# Patient Record
Sex: Female | Born: 1970 | Race: Black or African American | Hispanic: No | Smoking: Never smoker
Health system: Southern US, Community
[De-identification: ages and names within clinical notes are randomized; demographics above are authoritative.]

## PROBLEM LIST (undated history)

## (undated) DIAGNOSIS — Z8249 Family history of ischemic heart disease and other diseases of the circulatory system: Secondary | ICD-10-CM

## (undated) DIAGNOSIS — E042 Nontoxic multinodular goiter: Secondary | ICD-10-CM

## (undated) HISTORY — DX: Family history of ischemic heart disease and other diseases of the circulatory system: Z82.49

## (undated) HISTORY — PX: ABDOMINAL HYSTERECTOMY: SHX81

---

## 1997-07-26 HISTORY — PX: MYOMECTOMY: SHX85

## 1998-07-26 HISTORY — PX: MYOMECTOMY: SHX85

## 2002-07-26 HISTORY — PX: OTHER SURGICAL HISTORY: SHX169

## 2012-07-26 HISTORY — PX: OTHER SURGICAL HISTORY: SHX169

## 2013-02-27 ENCOUNTER — Other Ambulatory Visit: Payer: Self-pay | Admitting: Endocrinology

## 2013-02-27 DIAGNOSIS — E041 Nontoxic single thyroid nodule: Secondary | ICD-10-CM

## 2013-03-07 ENCOUNTER — Ambulatory Visit
Admission: RE | Admit: 2013-03-07 | Discharge: 2013-03-07 | Disposition: A | Discharge status: Home / Self Care | Payer: 59 | Source: Ambulatory Visit | Attending: Endocrinology | Admitting: Endocrinology

## 2013-03-07 ENCOUNTER — Other Ambulatory Visit (HOSPITAL_COMMUNITY)
Admission: RE | Admit: 2013-03-07 | Discharge: 2013-03-07 | Disposition: A | Discharge status: Home / Self Care | Payer: 59 | Source: Ambulatory Visit | Attending: Interventional Radiology | Admitting: Interventional Radiology

## 2013-03-07 DIAGNOSIS — E041 Nontoxic single thyroid nodule: Secondary | ICD-10-CM | POA: Insufficient documentation

## 2013-03-07 IMAGING — US US THYROID BIOPSY
1 series · 14 of 14 positions shown · non-contrast
Comparison: [DATE]

CLINICAL DATA: Dominant left mid thyroid nodule

ULTRASOUND GUIDED NEEDLE ASPIRATE BIOPSY OF THE THYROID GLAND

[Series 1: us thyroid biopsy · 0.06mm/px · 14 acquisitions, 14 frames shown]
[im 1/14]
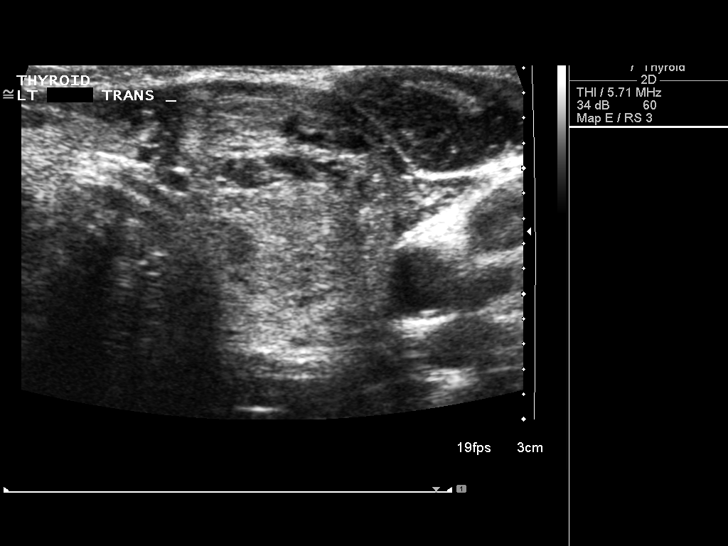
[im 2/14]
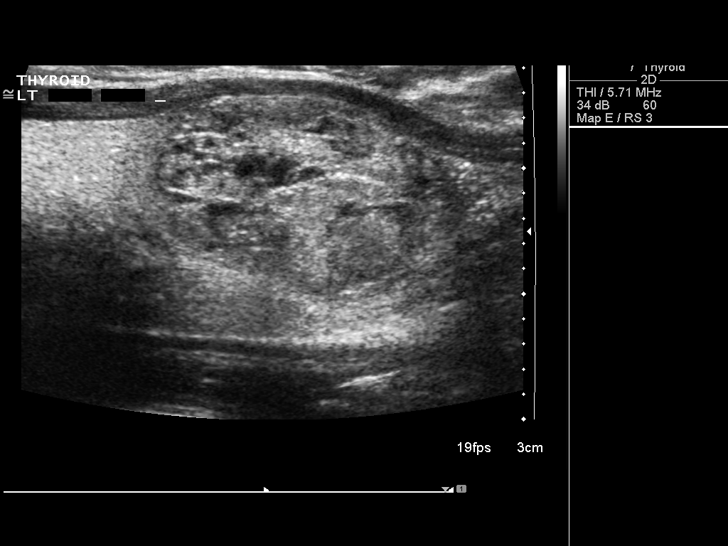
[im 3/14]
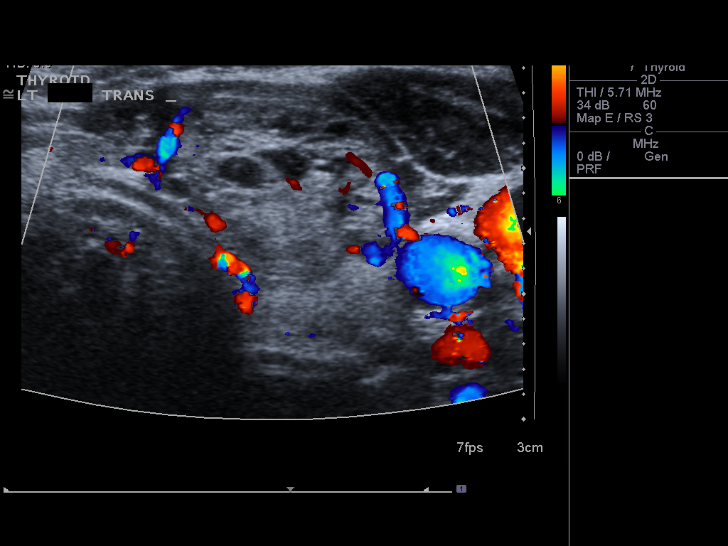
[im 4/14]
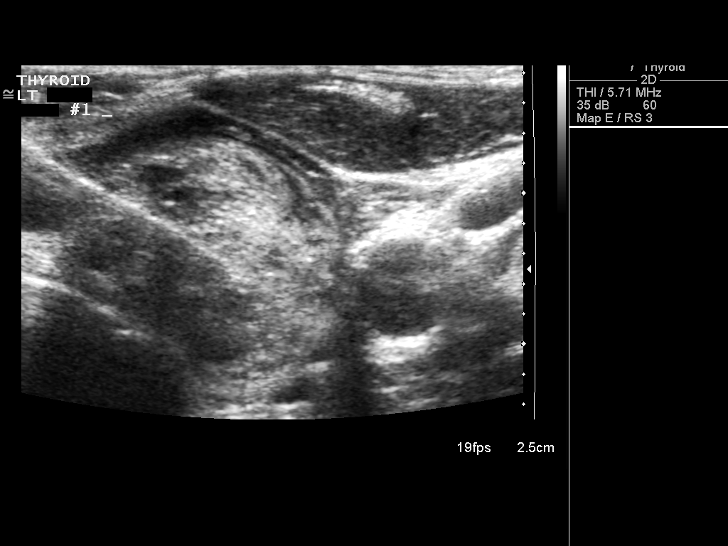
[im 5/14]
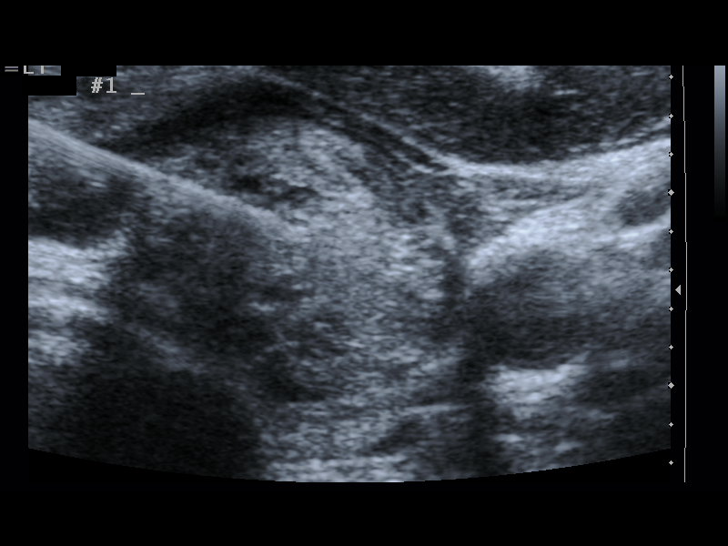
[im 6/14]
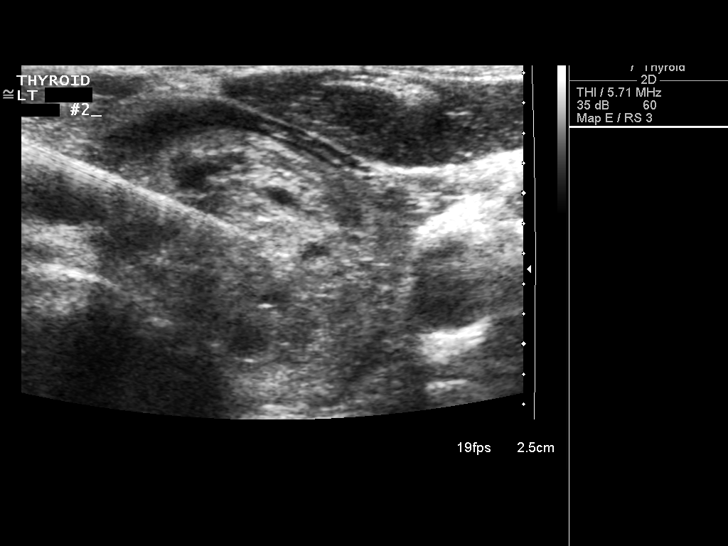
[im 7/14]
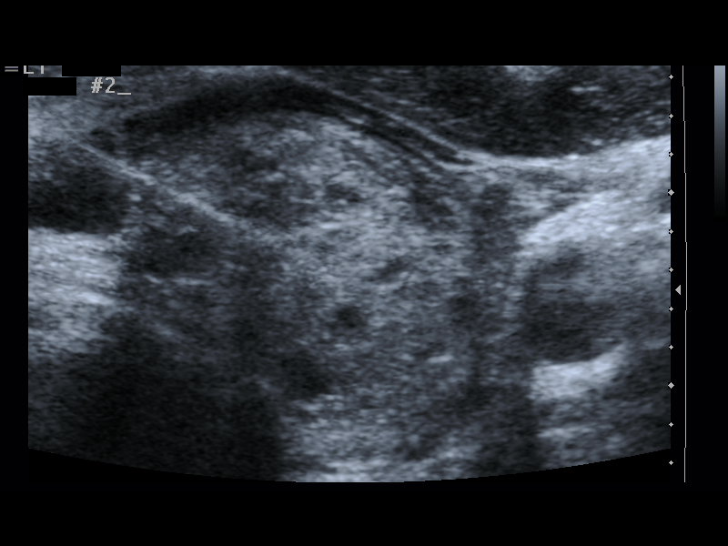
[im 8/14]
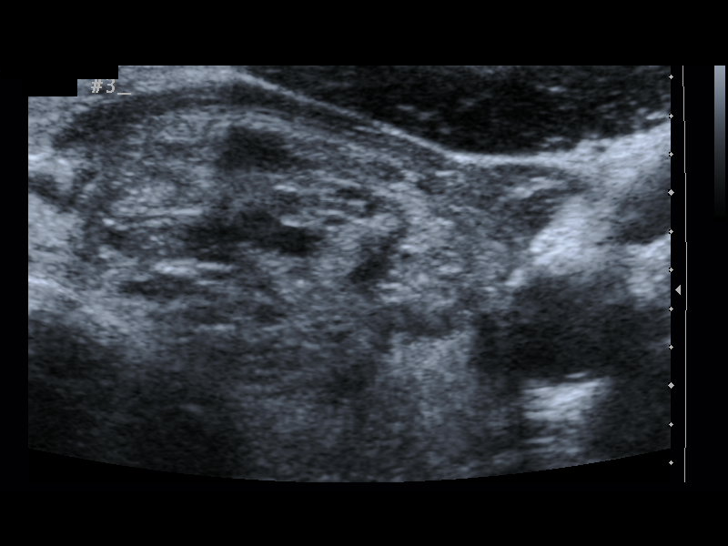
[im 9/14]
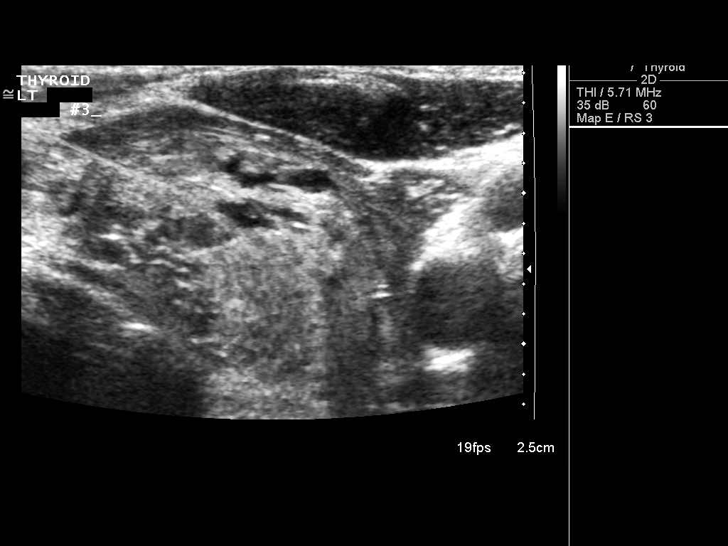
[im 10/14]
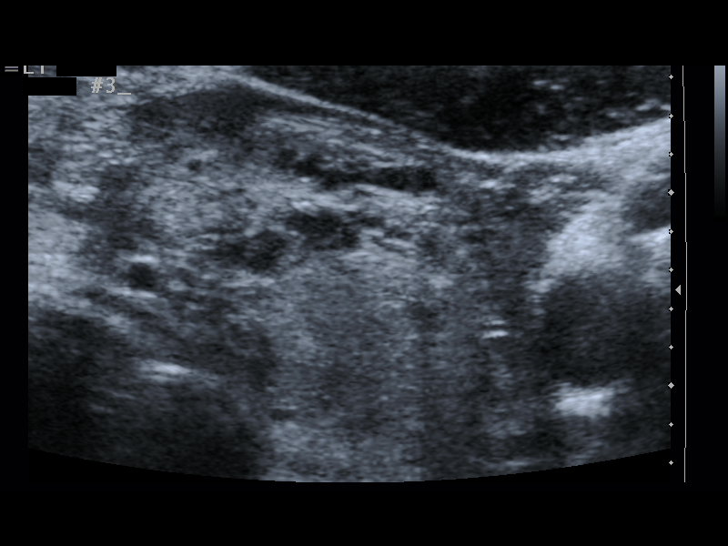
[im 11/14]
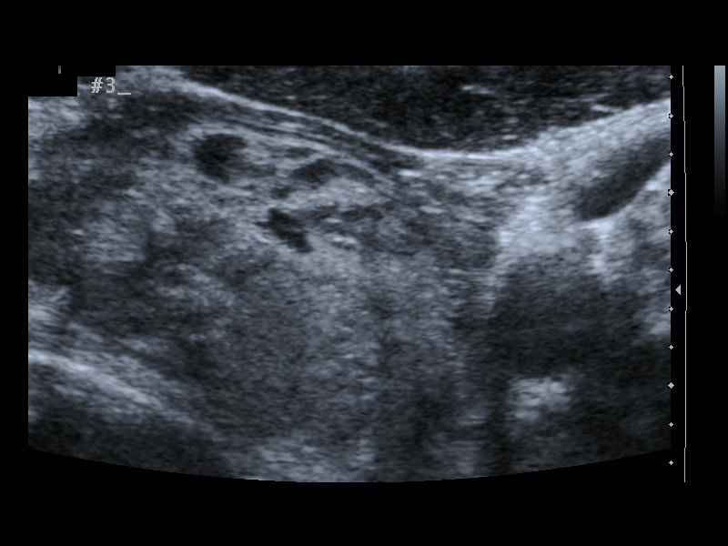
[im 12/14]
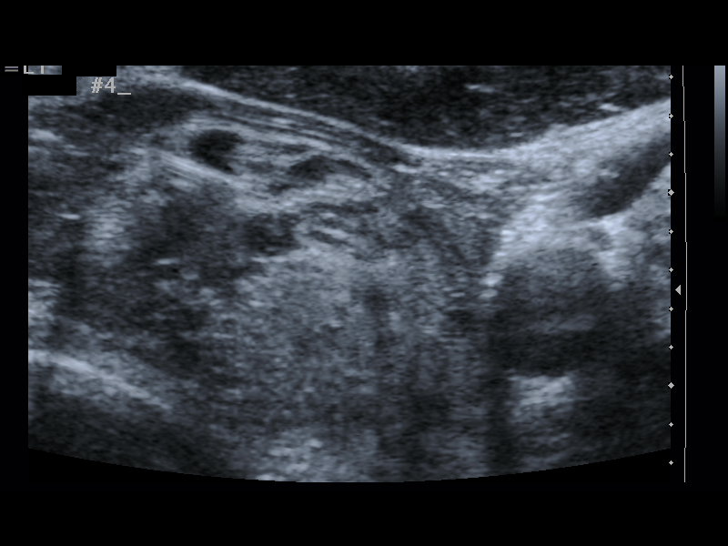
[im 13/14]
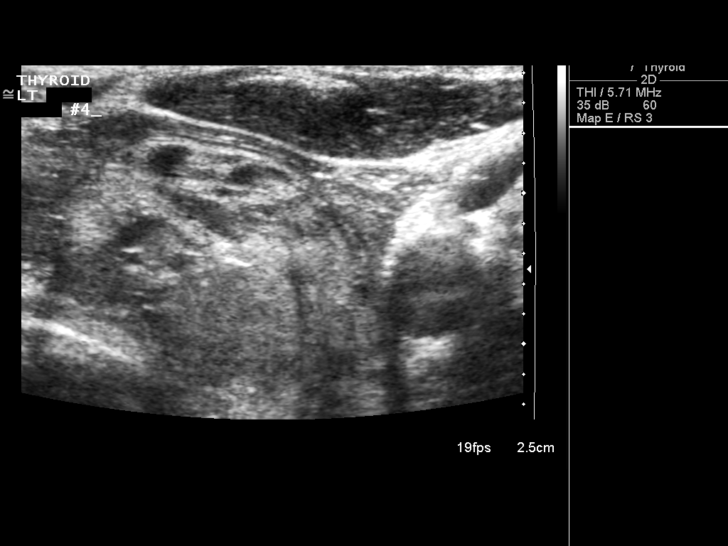
[im 14/14]
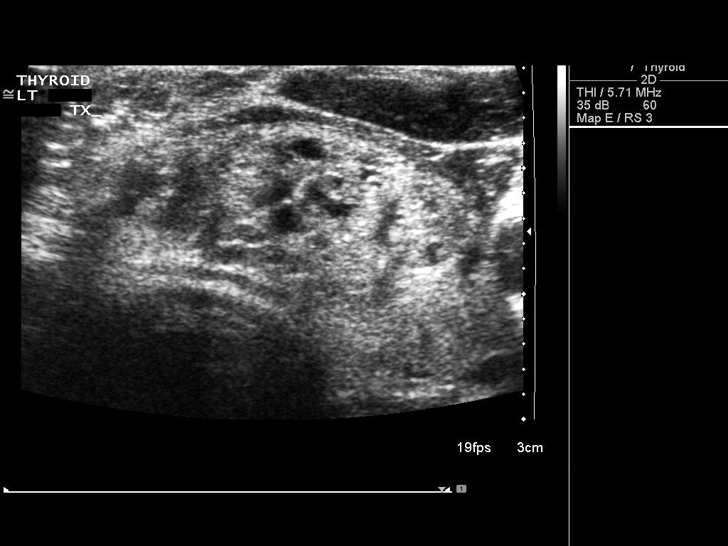

[14 of 14 positions shown; findings below may reference images not displayed]

Thyroid biopsy was thoroughly discussed with the patient and
questions were answered.  The benefits, risks, alternatives, and
complications were also discussed.  The patient understands and
wishes to proceed with the procedure.  Written consent was
obtained.

Ultrasound was performed to localize and mark an adequate site for
the biopsy.  The patient was then prepped and draped in a normal
sterile fashion.  Local anesthesia was provided with 1% lidocaine.
Using direct ultrasound guidance, 4 passes were made using 25 gauge
needles into the nodule within the left lobe of the thyroid.
Ultrasound was used to confirm needle placements on all occasions.
Specimens were sent to Pathology for analysis.

Complications:  No immediate
FINDINGS: Imaging confirms needle placement in the dominant left
thyroid nodule
IMPRESSION: Ultrasound guided needle aspirate biopsy performed of the dominant
left thyroid nodule.

## 2017-06-23 ENCOUNTER — Telehealth: Payer: Self-pay | Admitting: Hematology and Oncology

## 2017-06-23 NOTE — Telephone Encounter (Signed)
Spoke with patient re appointment with Dr. Lebron Conners 12/7 @ 8am. Demographic/insurance information confirmed. Per patient insurance is [facility] and she will provide information at visit.

## 2017-07-01 ENCOUNTER — Telehealth: Payer: Self-pay | Admitting: Hematology and Oncology

## 2017-07-01 ENCOUNTER — Ambulatory Visit (HOSPITAL_BASED_OUTPATIENT_CLINIC_OR_DEPARTMENT_OTHER): Payer: 59 | Admitting: Hematology and Oncology

## 2017-07-01 DIAGNOSIS — Z8249 Family history of ischemic heart disease and other diseases of the circulatory system: Secondary | ICD-10-CM | POA: Diagnosis not present

## 2017-07-01 NOTE — Telephone Encounter (Signed)
Gave avs and calendar for January 2019 °

## 2017-07-01 NOTE — Telephone Encounter (Signed)
Patient requested week later

## 2017-07-06 ENCOUNTER — Telehealth: Payer: Self-pay

## 2017-07-06 NOTE — Telephone Encounter (Signed)
Patient called stating that she saw Dr. Lebron Conners on Friday 07/01/17 and he stated he was going to call in a prescription. Patient does not remember the name of the medication.  Patient stated that her pharmacy did not receive a prescription order. Checked chart and no medications have been ordered. Patient stated when she checked out she had the scheduler call back to Dr. Lebron Conners to verify he was going to order medication. Informed her that Dr. Lebron Conners is out of the office until January. Patient was upset and expressed frustration. Listened to patient and offered to check with Dr. Lebron Conners when he returns to see what medication needs to be ordered.  Patient stated she will call her primary care physician and she does not plan to follow-up with  Dr. Lebron Conners.

## 2017-07-07 ENCOUNTER — Telehealth: Payer: Self-pay

## 2017-07-07 ENCOUNTER — Other Ambulatory Visit: Payer: Self-pay | Admitting: Internal Medicine

## 2017-07-07 ENCOUNTER — Other Ambulatory Visit: Payer: Self-pay

## 2017-07-07 MED ORDER — RIVAROXABAN (XARELTO) VTE STARTER PACK (15 & 20 MG): ORAL_TABLET | ORAL | 0 refills | Status: DC

## 2017-07-07 NOTE — Telephone Encounter (Signed)
Received a call this am from [place] at [place] about patient needing prescription for a trip to [place]. Per Erasmo Downer, the patient has a strong family history of PE. Patient saw Dr. Lebron Conners on Friday but medication was not ordered. Dr. Julien Nordmann ordered Xarelto. [facility] into [facility] on [address] per patient request.

## 2017-07-08 ENCOUNTER — Encounter: Payer: Self-pay | Admitting: Hematology and Oncology

## 2017-07-08 NOTE — Progress Notes (Signed)
Received rejected message from [facility] for [facility]to.  Submitted via Cover My Meds:  Temprance Wyre (Key: [id number])   This request has been approved. Please note any additional information provided by [facility] at the bottom of your screen.

## 2017-07-31 ENCOUNTER — Encounter: Payer: Self-pay | Admitting: Hematology and Oncology

## 2017-07-31 DIAGNOSIS — Z8249 Family history of ischemic heart disease and other diseases of the circulatory system: Secondary | ICD-10-CM

## 2017-07-31 HISTORY — DX: Family history of ischemic heart disease and other diseases of the circulatory system: Z82.49

## 2017-07-31 NOTE — Progress Notes (Signed)
[facility] New Visit:  Assessment: Family history of pulmonary embolism 47 y.o. 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  There are 2 ways we can assess the situation, one includes extensive thrombophilia testing including protein profiles etc. the other is to preemptively place patient on prophylactic anticoagulation for the.  At which she will be exposed to risk of the clots.  She has had successful pregnancies in the past with no clubbing issues.  Nevertheless, considering her family history, I believe that prophylactic treatment with rivaroxaban at 10 mg daily starting 1 day prior and continuing until 1 day after the planned flights is reasonable.  Plan: --Rivaroxaban 10mg  PO QDay as above --Return to clinic in  6 weeks At which time we may formally address the thrombophilia that is suspected by obtaining additional lab work.     No orders of the defined types were placed in this encounter.   All questions were answered.  . The patient knows to call the clinic with any problems, questions or concerns.  This note was electronically signed.    History of Presenting Illness Betty Gutierrez 47 y.o. presenting to the [place] for recommendations regarding management of risk of venous thromboembolism patient has an extensive family history.  Of relatives with pulmonary emboli.  This includes her mother, her maternal aunt who is her mother's twin, and 2 sisters of the patient.  All of them have developed a clots after they have taken a prolonged trip on an airplane to their home land.  Patient is planning a summer flight and is reasonably concerned about possibility of developing clots and possible pulmonary emboli herself.  Patient has had 3 pregnancies 2 of which resulted in the live births and one in abortion.  Patient has had no complications during the pregnancy  is.  She herself has never had any pain or swelling in the legs and no clots.  Oncological/hematological History:  Medical History: History reviewed. No pertinent past medical history.  Surgical History: History reviewed. No pertinent surgical history.  Family History: Family History  Problem Relation Age of Onset  . Pulmonary embolism Mother        following international flight  . Pulmonary embolism Sister   . Pulmonary embolism Maternal Aunt        Twin of the patient's mother, also occurred following and international flight  . Pulmonary embolism Sister     Social History: Social History   Socioeconomic History  . Marital status: Married    Spouse name: Not on file  . Number of children: Not on file  . Years of education: Not on file  . Highest education level: Not on file  Social Needs  . Financial resource strain: Not on file  . Food insecurity - worry: Not on file  . Food insecurity - inability: Not on file  . Transportation needs - medical: Not on file  . Transportation needs - non-medical: Not on file  Occupational History  . Not on file  Tobacco Use  . Smoking status: Not on file  Substance and Sexual Activity  . Alcohol use: Not on file  . Drug use: Not on file  . Sexual activity: Not on file  Other Topics Concern  . Not on file  Social History Narrative  . Not on file    Allergies: Allergies not on file  Medications:  Current Outpatient Medications  Medication  Sig Dispense Refill  . Rivaroxaban 15 & 20 MG TBPK Take as directed on package: Start with one 15mg  tablet by mouth twice a day with food. On Day 22, switch to one 20mg  tablet once a day with food. 51 each 0   No current facility-administered medications for this visit.     Review of Systems: Review of Systems  All other systems reviewed and are negative.    PHYSICAL EXAMINATION Blood pressure 117/81, pulse 73, temperature 98.6 F (37 C), temperature source Oral, resp. rate 18,  height 5\' 4"  (1.626 m), weight 195 lb 6.4 oz (88.6 kg), SpO2 98 %.  ECOG PERFORMANCE STATUS: 0 - Asymptomatic  Physical Exam  Constitutional: She is oriented to person, place, and time and well-developed, well-nourished, and in no distress. No distress.  HENT:  Head: Normocephalic and atraumatic.  Mouth/Throat: Oropharynx is clear and moist. No oropharyngeal exudate.  Eyes: Conjunctivae and EOM are normal. Pupils are equal, round, and reactive to light. No scleral icterus.  Neck: Normal range of motion. Neck supple. No thyromegaly present.  Cardiovascular: Normal rate, regular rhythm and normal heart sounds.  No murmur heard. Pulmonary/Chest: Effort normal and breath sounds normal. No respiratory distress. She has no wheezes. She has no rales.  Abdominal: Soft. Bowel sounds are normal. She exhibits no distension. There is no tenderness. There is no rebound.  Musculoskeletal: She exhibits no edema.  Lymphadenopathy:    She has no cervical adenopathy.  Neurological: She is alert and oriented to person, place, and time. She has normal reflexes. No cranial nerve deficit.  Skin: Skin is warm and dry. No rash noted. She is not diaphoretic. No erythema. No pallor.     LABORATORY DATA: I have personally reviewed the data as listed: No visits with results within 1 Week(s) from this visit.  Latest known visit with results is:  No results found for any previous visit.         Ardath Sax, MD

## 2017-07-31 NOTE — Assessment & Plan Note (Signed)
47 y.o. 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  There are 2 ways we can assess the situation, one includes extensive thrombophilia testing including protein profiles etc. the other is to preemptively place patient on prophylactic anticoagulation for the.  At which she will be exposed to risk of the clots.  She has had successful pregnancies in the past with no clubbing issues.  Nevertheless, considering her family history, I believe that prophylactic treatment with rivaroxaban at 10 mg daily starting 1 day prior and continuing until 1 day after the planned flights is reasonable.  Plan: --Rivaroxaban 10mg  PO QDay as above --Return to clinic in  6 weeks At which time we may formally address the thrombophilia that is suspected by obtaining additional lab work.

## 2017-08-19 ENCOUNTER — Ambulatory Visit: Payer: 59 | Admitting: Hematology and Oncology

## 2018-11-02 ENCOUNTER — Telehealth: Payer: Self-pay | Admitting: *Deleted

## 2018-11-02 NOTE — Telephone Encounter (Signed)
Called and scheduled the patient for a WebEx meeting with Dr. Denman George on 4/15 at 11:15am. Sent the patient the invite and instructions.

## 2018-11-06 ENCOUNTER — Telehealth: Payer: Self-pay | Admitting: *Deleted

## 2018-11-06 NOTE — Telephone Encounter (Signed)
Called and spoke with the patient regarding her appt on 4/15. The patient has received the WebEx invite and instructions

## 2018-11-08 ENCOUNTER — Inpatient Hospital Stay: Payer: 59 | Attending: Gynecologic Oncology | Admitting: Gynecologic Oncology

## 2018-11-08 ENCOUNTER — Encounter: Payer: Self-pay | Admitting: Gynecologic Oncology

## 2018-11-08 DIAGNOSIS — D259 Leiomyoma of uterus, unspecified: Secondary | ICD-10-CM

## 2018-11-08 DIAGNOSIS — N8500 Endometrial hyperplasia, unspecified: Secondary | ICD-10-CM

## 2018-11-08 DIAGNOSIS — E669 Obesity, unspecified: Secondary | ICD-10-CM | POA: Insufficient documentation

## 2018-11-08 DIAGNOSIS — Z6832 Body mass index (BMI) 32.0-32.9, adult: Secondary | ICD-10-CM | POA: Insufficient documentation

## 2018-11-08 NOTE — Progress Notes (Addendum)
Gynecologic Oncology Telehealth Consult Note: Gyn-Onc  I connected with Betty Gutierrez on 11/08/18 at 11:15 AM EDT by telephone and verified that I am speaking with the correct person using two identifiers.  I discussed the limitations, risks, security and privacy concerns of performing an evaluation and management service by telemedicine and the availability of in-person appointments. I also discussed with the patient that there may be a patient responsible charge related to this service. The patient expressed understanding and agreed to proceed.  Other persons participating in the visit and their role in the encounter: Joylene John NP who facilitated connecting the call.  Patient's location: Home Provider's location: [place]  Chief Complaint: No chief complaint on file.   Consult was requested by Dr. Garwin Brothers for the evaluation of Betty Gutierrez 48 y.o. Gutierrez  Assessment/Plan:  Betty Gutierrez  is a 48 y.o.  year old with endometrial hyperplasia without atypia in the setting of obesity, a fibroid uterus and a family history for PE's (concern for hypercoagulability state).  I am recommending total hysterectomy, bilateral salpingectomy, possible left ovarian cystectomy.  We discussed that endometrial hyperplasia is associated with a 1-3% risk for progression to cancer if it is no associated with atypia.  I discussed options with the patient including treatment with progesterone by her IUD.  There is no evidence that Mirena progestin releasing IUD is associated with the same thrombotic risk as systemic progestins.  However the patient who prefers not to take medical therapy is electing to not receive medical treatments.  She is accepting and willing to undergo hysterectomy understanding that this will result in permanent sterilization.  I discussed that we will evaluate her uterus with a repeat ultrasound in approximately 1 month's time and a physical examination.   If it is determined that she is a candidate for minimally invasive approach this would be ideal given her high risk factors for perioperative complications particularly VTE, infection, and bleeding risk.  I discussed that if we are able to accomplish the surgical dissection through minimally invasive surgery, we will still likely need to perform a mini laparotomy for specimen delivery.  If there is no safe access to the uterine artery laterally we will require laparotomy for visualization for the entirety of the procedure.  Given her increased thrombotic state she will be prescribed a dose of Lovenox on the day of surgery and then her typical anticoagulant therapy regimen for 1 month.  Postoperatively.  We will recommend she stop using her African herbs preoperatively in order to decrease any potential interaction with anesthetic agents or increase her risk for bleeding.  I discussed the assessment and treatment plan with the patient. The patient was provided with an opportunity to ask questions and all were answered. The patient agreed with the plan and demonstrated an understanding of the instructions.   The patient was advised to call back or see an in-person evaluation if the symptoms worsen or if the condition fails to improve as anticipated.   I provided 45 minutes of face-to-face video visit time during this encounter, and > 50% was spent counseling as documented under my assessment & plan.  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  The patient reports a longstanding history of symptomatic uterine fibroids.  After the vaginal delivery of her first child she underwent an abdominal myomectomy in 2000.  Following  that she had scheduled cesarean section in 2004.  Her uterine fibroid symptoms progressed over the subsequent decade and then in 2014 she underwent  uterine artery embolization at [facility]  This resulted in some decrease in size of her uterine fibroids and improvement in symptoms of menorrhagia.  Since 2018 she has had increasing menorrhagia and bulk symptoms of her fibroids.  She was seen by Dr. cousins on October 13, 2018.  TVUS on October 13, 2018 showed a uterus measuring with several fibroids including a 2.8cm fibroid in the midline, a 3.8cm fibroid right posterior uterus, and a 3cm fibroid left anterior. There is a 10cm left pedunculated fibroid extending above the umbilicus attached by a stalk.  There was also a left ovarian cyst measuring 2.5cm with complex features and peripheral blood flow noted. The right ovary was normal.   An endometrial biopsy was performed on 10/13/18 which showed endometrial hyperplasia negative for atypia (there was no comment regarding simple/complex features).   The patient reports an abdominal myomectomy in 2000. She has also undergone uncomplicated uterine artery embolization on 04/27/14. Following this her menstrual cycles were more manageable.  Her medical history is significant for a known hypercoagulable state. . 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 She has been seen by hematology. A coagulopathy workup was performed and they recommended she take prophylactic Rivaroxaban 10mg  one day before and 10 days after any flights. This makes her uterine bleeding worse. She is obese with a BMI of 32 kg/m2.   She takes only African herbal teas to help with her fibroids.  Current Meds:  Outpatient Encounter Medications as of 11/08/2018  Medication Sig  . Rivaroxaban 15 & 20 MG TBPK Take as directed on package: Start with one 15mg  tablet by mouth twice a day with food. On Day 22, switch to one 20mg  tablet once a day with food.   No facility-administered encounter medications on file as of 11/08/2018.     Allergy: No Known Allergies  Social Hx:   Social History   Socioeconomic  History  . Marital status: Married    Spouse name: Not on file  . Number of children: Not on file  . Years of education: Not on file  . Highest education level: Not on file  Occupational History  . Not on file  Social Needs  . Financial resource strain: Not on file  . Food insecurity:    Worry: Not on file    Inability: Not on file  . Transportation needs:    Medical: Not on file    Non-medical: Not on file  Tobacco Use  . Smoking status: Never Smoker  . Smokeless tobacco: Never Used  Substance and Sexual Activity  . Alcohol use: No    Frequency: Never  . Drug use: No  . Sexual activity: Not on file  Lifestyle  . Physical activity:    Days per week: Not on file    Minutes per session: Not on file  . Stress: Not on file  Relationships  . Social connections:    Talks on phone: Not on file    Gets together: Not on file    Attends religious service: Not on file    Active member of club or organization: Not on file    Attends meetings of clubs or organizations: Not on file    Relationship status: Not on file  . Intimate partner violence:    Fear of current or ex partner: Not  on file    Emotionally abused: Not on file    Physically abused: Not on file    Forced sexual activity: Not on file  Other Topics Concern  . Not on file  Social History Narrative  . Not on file    Past Surgical Hx: No past surgical history on file.  Past Medical Hx:  Past Medical History:  Diagnosis Date  . Family history of pulmonary embolism 07/31/2017    Past Gynecological History:  Abdominal myomectomy in 2000, uterine artery embolization 2014. No history of abnormal paps. SVD x 1 and cesarean x 1. No LMP recorded.  Family Hx:  Family History  Problem Relation Age of Onset  . Pulmonary embolism Mother        following international flight  . Pulmonary embolism Sister   . Pulmonary embolism Maternal Aunt        Twin of the patient's mother, also occurred following and international  flight  . Pulmonary embolism Sister     Review of Systems:  Constitutional  Feels well,    ENT Normal appearing ears and nares bilaterally Skin/Breast  No rash, sores, jaundice, itching, dryness Cardiovascular  No chest pain, shortness of breath, or edema  Pulmonary  No cough or wheeze.  Gastro Intestinal  No nausea, vomitting, or diarrhoea. No bright red blood per rectum, no abdominal pain, change in bowel movement, or constipation.  Genito Urinary  No frequency, urgency, dysuria, + vaginal bleeding Musculo Skeletal  No myalgia, arthralgia, joint swelling or pain  Neurologic  No weakness, numbness, change in gait,  Psychology  No depression, anxiety, insomnia.   Vitals:   not available  Physical Exam: deferred  Betty Solo, MD  11/08/2018, 10:04 AM   Addendum:  US obtained from Dr Cousin's office from 10/04/18: uterus measures 15.8x7.9x10.9cm (with the largest pedunculated fibroid measuring 9.8x8x8.2cm.  Lt ovary contains a complex cyst measuring 2.5cm with peripheral blood flow noted.

## 2018-11-22 ENCOUNTER — Encounter: Payer: Self-pay | Admitting: Oncology

## 2018-11-22 DIAGNOSIS — N8500 Endometrial hyperplasia, unspecified: Secondary | ICD-10-CM

## 2018-11-23 ENCOUNTER — Telehealth: Payer: Self-pay | Admitting: Oncology

## 2018-11-23 NOTE — Telephone Encounter (Signed)
Called Betty Gutierrez and advised her of appointment with Dr. Denman George on 5/15 at 10:45.  She verbalized agreement.  Also discussed that an ultrasound of her pelvis has been scheduled for 12/07/18 at 1:30 pm.  She asked if this has to be done since she already had one on 10/04/18.  She said they are expensive so she wants to double check if she really needs it.  Advised her that Dr. Denman George recommended a repeat ultrasound in her last office note to assess before surgery.  Betty Gutierrez said she still wants to double check.

## 2018-11-28 ENCOUNTER — Telehealth: Payer: Self-pay | Admitting: Oncology

## 2018-11-28 NOTE — Telephone Encounter (Signed)
Called Betty Gutierrez and advised her that Dr. Denman George does need the repeat ultrasound on 12/07/18.  She verbalized agreement and asked that an email is sent to her with appointment details.

## 2018-11-30 ENCOUNTER — Telehealth: Payer: Self-pay | Admitting: Oncology

## 2018-11-30 NOTE — Telephone Encounter (Signed)
Called Betty Gutierrez regarding possible surgery date of 12/19/18.  She said that would not work for her and she would prefer after June 15.  Also advised her to stop drinking African teas now to prepare for surgery.  She verbalized agreement.

## 2018-12-07 ENCOUNTER — Ambulatory Visit (HOSPITAL_COMMUNITY): Payer: 59

## 2018-12-07 ENCOUNTER — Telehealth: Payer: Self-pay | Admitting: *Deleted

## 2018-12-07 NOTE — Telephone Encounter (Signed)
Patient called back and cancelled appts for today and tomorrow. Patient stated "I am having a lot of anxiety about these appts. The whole wiping down stuff and masks just has my freaked out. I just don't want to go where there is sick people." Explained to the patient that "we are not the [facility], and that those patients are off site at a different facility, but that we understood her concerns." Patient stated "I just can't come in there, I will call back once this is settled. Please give my apologies to Dr. Denman George and [facility] APP."

## 2018-12-07 NOTE — Telephone Encounter (Signed)
Called and spoke with the patient regarding her appt for tomorrow. Patient has no signs/sympotms, has not traveled and has had no exposure to COVID. Explained the new check in process, new parking process and the mask/no visitor policy.  

## 2018-12-08 ENCOUNTER — Inpatient Hospital Stay: Payer: 59 | Admitting: Gynecologic Oncology

## 2019-03-26 ENCOUNTER — Telehealth: Payer: Self-pay | Admitting: Oncology

## 2019-03-26 NOTE — Telephone Encounter (Signed)
Betty Gutierrez called and asked if she can get a copy of her medical records.  Provided her with Medical Records phone number and also helped her set up her MyChart account.

## 2021-04-28 ENCOUNTER — Telehealth: Payer: Self-pay | Admitting: Oncology

## 2021-04-28 NOTE — Telephone Encounter (Signed)
Betty Gutierrez called and said she is ready to schedule surgery now.  Advised her that Dr. Denman George has left the practice.  She is interested in seeing Dr. Berline Lopes and also would like to schedule surgery before the end of the year.  Advised her that I will check with our scheduler and call her back.

## 2021-04-29 ENCOUNTER — Telehealth: Payer: Self-pay | Admitting: *Deleted

## 2021-04-29 NOTE — Telephone Encounter (Signed)
Spoke with the patient and scheduled a follow up appt with Dr Berline Lopes on 10/17 at 3:15 pm

## 2021-05-11 ENCOUNTER — Ambulatory Visit: Payer: 59 | Admitting: Gynecologic Oncology

## 2021-05-13 ENCOUNTER — Inpatient Hospital Stay: Payer: 59

## 2021-05-13 ENCOUNTER — Encounter: Payer: Self-pay | Admitting: Gynecologic Oncology

## 2021-05-13 ENCOUNTER — Inpatient Hospital Stay: Payer: 59 | Attending: Gynecologic Oncology | Admitting: Gynecologic Oncology

## 2021-05-13 ENCOUNTER — Other Ambulatory Visit: Payer: Self-pay

## 2021-05-13 VITALS — BP 118/82 | HR 80 | Temp 98.4°F | Resp 16 | Ht 64.0 in | Wt 193.0 lb

## 2021-05-13 DIAGNOSIS — R1900 Intra-abdominal and pelvic swelling, mass and lump, unspecified site: Secondary | ICD-10-CM

## 2021-05-13 DIAGNOSIS — N939 Abnormal uterine and vaginal bleeding, unspecified: Secondary | ICD-10-CM

## 2021-05-13 DIAGNOSIS — N951 Menopausal and female climacteric states: Secondary | ICD-10-CM | POA: Diagnosis not present

## 2021-05-13 DIAGNOSIS — Z6832 Body mass index (BMI) 32.0-32.9, adult: Secondary | ICD-10-CM | POA: Diagnosis not present

## 2021-05-13 DIAGNOSIS — N8500 Endometrial hyperplasia, unspecified: Secondary | ICD-10-CM | POA: Diagnosis present

## 2021-05-13 DIAGNOSIS — Z8249 Family history of ischemic heart disease and other diseases of the circulatory system: Secondary | ICD-10-CM

## 2021-05-13 DIAGNOSIS — D259 Leiomyoma of uterus, unspecified: Secondary | ICD-10-CM | POA: Diagnosis not present

## 2021-05-13 DIAGNOSIS — R140 Abdominal distension (gaseous): Secondary | ICD-10-CM | POA: Diagnosis not present

## 2021-05-13 DIAGNOSIS — D509 Iron deficiency anemia, unspecified: Secondary | ICD-10-CM | POA: Diagnosis not present

## 2021-05-13 DIAGNOSIS — Z832 Family history of diseases of the blood and blood-forming organs and certain disorders involving the immune mechanism: Secondary | ICD-10-CM | POA: Diagnosis not present

## 2021-05-13 DIAGNOSIS — E669 Obesity, unspecified: Secondary | ICD-10-CM | POA: Diagnosis not present

## 2021-05-13 LAB — CBC (CANCER CENTER ONLY)
HCT: 28.9 % — ABNORMAL LOW (ref 36.0–46.0)
Hemoglobin: 8.2 g/dL — ABNORMAL LOW (ref 12.0–15.0)
MCH: 19.5 pg — ABNORMAL LOW (ref 26.0–34.0)
MCHC: 28.4 g/dL — ABNORMAL LOW (ref 30.0–36.0)
MCV: 68.6 fL — ABNORMAL LOW (ref 80.0–100.0)
Platelet Count: 319 10*3/uL (ref 150–400)
RBC: 4.21 MIL/uL (ref 3.87–5.11)
RDW: 22.1 % — ABNORMAL HIGH (ref 11.5–15.5)
WBC Count: 4.6 10*3/uL (ref 4.0–10.5)
nRBC: 0 % (ref 0.0–0.2)

## 2021-05-13 NOTE — Patient Instructions (Signed)
Was a pleasure meeting you today.  We will get CBC and iron studies.  We will tentatively work to get surgery scheduled on November 15.  Depending on your blood work from today, we may delay the surgery if you need iron infusions.  We will also work to get you scheduled back to see hematology regarding the issue of blood thinners around the time of surgery.

## 2021-05-13 NOTE — Progress Notes (Signed)
Gynecologic Oncology Return Clinic Visit  05/13/21  Reason for Visit: Reestablish care, treatment planning  Treatment History: [name]  was initially seen by Dr. Denman Gutierrez in 10/2018 for consult in the setting of endometrial hyperplasia without atypia in the setting of obesity, a fibroid uterus and a family history for PE's (concern for hypercoagulability state). Total hysterectomy, BS and possible left ovarian cystectomy was recommended.  History taken at first visit: The patient reports a longstanding history of symptomatic uterine fibroids.  After the vaginal delivery of her first child she underwent an abdominal myomectomy in 2000.  Following that she had scheduled cesarean section in 2004.  Her uterine fibroid symptoms progressed over the subsequent decade and then in 2014 she underwent uterine artery embolization at [facility].  This resulted in some decrease in size of her uterine fibroids and improvement in symptoms of menorrhagia.   Since 2018 she has had increasing menorrhagia and bulk symptoms of her fibroids.  She was seen by Dr. Garwin Gutierrez on October 13, 2018.   TVUS on October 13, 2018 showed a uterus measuring with several fibroids including a 2.8cm fibroid in the midline, a 3.8cm fibroid right posterior uterus, and a 3cm fibroid left anterior. There is a 10cm left pedunculated fibroid extending above the umbilicus attached by a stalk. There was also a left ovarian cyst measuring 2.5cm with complex features and peripheral blood flow noted. The right ovary was normal.    An endometrial biopsy was performed on 10/13/18 which showed endometrial hyperplasia negative for atypia (there was no comment regarding simple/complex features).    Her medical history is significant for a known hypercoagulable state. 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 She has been seen by hematology. It was recommended she take prophylactic Rivaroxaban 10mg  one day before and 10 days  after any flights. This makes her uterine bleeding worse.  She is obese with a BMI of 32 kg/m2.   Interval History: Patient presents today for further discussion regarding surgical intervention.  As described above, she had a visit with my partner about 2-1/2 years ago in the setting of a biopsy which showed endometrial hyperplasia.  In the interim, she has been seen at [facility].  Most recently, she was seen at the end of September for an attempt at endometrial biopsy but this was not performed as the cervix could not be visualized.  She underwent repeat pelvic ultrasound in early July of this year which showed a uterus measuring 13.5 x 9.7 x 11.5 cm with an endometrial lining of 8.1 mm.  Right ovary measured up to 4.1 cm, left ovary not well visualized.  Uterus had multiple fibroids including a midline abdominal fibroid measuring 12.2 cm that appears pedunculated.  There is a posterior fibroid measuring almost 9 cm and a right uterine fibroid with a calcified border measuring almost 6.5 cm.    She is previously seen a hematologist here as of 2018.  Given her family history, decision was made that she would take rivaroxaban around the time of long flights.  I see discussion about getting hypercoagulability work-up, it but I do not see these labs either in our system or in care everywhere.  In terms of bleeding, she still has monthly menses although did not have a period this month.  She describes her menses as being on the heavier side, and heavier than they were prior to 2018.  She denies any recent changes to her bleeding pattern.  She occasionally  will have spotting for 1 or 2 days midcycle between periods.  She has some pain and cramping with her menses.  She has been taking iron and is curious about her iron levels.  She notes hot flashes or what she thinks is hot flashes that have developed recently.  Past Medical/Surgical History: Past Medical History:  Diagnosis Date   Family history of  pulmonary embolism 07/31/2017    Past Surgical History:  Procedure Laterality Date   Cesarian  N/A 2004   MYOMECTOMY N/A 2000   uterine fibroid emoblization  N/A 2014    Family History  Problem Relation Age of Onset   Pulmonary embolism Mother        following international flight   Breast cancer Mother    Prostate cancer Father    Pulmonary embolism Sister    Pulmonary embolism Sister    Pulmonary embolism Maternal Aunt        Twin of the patient's mother, also occurred following and international flight   Colon cancer Neg Hx    Ovarian cancer Neg Hx    Endometrial cancer Neg Hx    Pancreatic cancer Neg Hx     Social History   Socioeconomic History   Marital status: Divorced    Spouse name: Not on file   Number of children: Not on file   Years of education: Not on file   Highest education level: Not on file  Occupational History   Not on file  Tobacco Use   Smoking status: Never   Smokeless tobacco: Never  Substance and Sexual Activity   Alcohol use: Yes    Alcohol/week: 1.0 standard drink    Types: 1 Glasses of wine per week   Drug use: No   Sexual activity: Not Currently  Other Topics Concern   Not on file  Social History Narrative   Not on file   Social Determinants of Health   Financial Resource Strain: Not on file  Food Insecurity: Not on file  Transportation Needs: Not on file  Physical Activity: Not on file  Stress: Not on file  Social Connections: Not on file    Current Medications:  Current Outpatient Medications:    iron polysaccharides (NIFEREX) 150 MG capsule, Take 150 mg by mouth daily., Disp: , Rfl:    Rivaroxaban 15 & 20 MG TBPK, Take as directed on package: Start with one 15mg  tablet by mouth twice a day with food. On Day 22, switch to one 20mg  tablet once a day with food. (Patient not taking: Reported on 05/13/2021), Disp: 51 each, Rfl: 0  Review of Systems: Pertinent positives include abdominal distention, hot flashes, menstrual  problems. Denies appetite changes, fevers, chills, fatigue, unexplained weight changes. Denies hearing loss, neck lumps or masses, mouth sores, ringing in ears or voice changes. Denies cough or wheezing.  Denies shortness of breath. Denies chest pain or palpitations. Denies leg swelling. Denies abdominal pain, blood in stools, constipation, diarrhea, nausea, vomiting, or early satiety. Denies pain with intercourse, dysuria, frequency, hematuria or incontinence.  Denies joint pain, back pain or muscle pain/cramps. Denies itching, rash, or wounds. Denies dizziness, headaches, numbness or seizures. Denies swollen lymph nodes or glands, denies easy bruising or bleeding. Denies anxiety, depression, confusion, or decreased concentration.  Physical Exam: BP 118/82 (BP Location: Left Arm, Patient Position: Sitting)   Pulse 80   Temp 98.4 F (36.9 C) (Oral)   Resp 16   Ht 5\' 4"  (1.626 m)   Wt 193 lb (87.5 kg)  SpO2 100%   BMI 33.13 kg/m  General: Alert, oriented, no acute distress. HEENT: Normocephalic, atraumatic, sclera anicteric. Chest: Clear to auscultation bilaterally.  No wheezes or rhonchi. Cardiovascular: Regular rate and rhythm, no murmurs. Abdomen: Normoactive bowel sounds.  Mildly distended, nontender.  Pedunculated and mobile mass measuring approximately 10 cm is felt in the mid and left upper abdomen.  Uterus itself is bulbous although free from the sidewalls and mobile.  Well-healed scar. Extremities: Grossly normal range of motion.  Warm, well perfused.  No edema bilaterally. Skin: No rashes or lesions noted. Lymphatics: No cervical, supraclavicular, or inguinal adenopathy. GU: Normal appearing external genitalia without erythema, excoriation, or lesions.  Speculum exam reveals well rugated vaginal mucosa, no bleeding or discharge.  On speculum exam, the anterior cervix is visualized and normal in appearance.  Bimanual exam reveals heavy fibroid uterus with bulbous lower  uterine segment but mobile from the sidewall, no nodularity.    Laboratory & Radiologic Studies: See HPI  Assessment & Plan: Betty Gutierrez is a 50 y.o. woman with large fibroid uterus who presents for discussion regarding surgery today.  Discussed work-up thus far including visit with my prior partners, recent visits with [facility], and last ultrasound that she had over the summer. She has a significantly enlarged multi-fibroid uterus that causes her abdominal symptoms and has led to iron-deficiency anemia. She is currently taking oral iron.   We also discussed biopsy from 2020 showing hyperplasia. We discussed the evolution of overgrowth to precancer and ultimately uterine cancer. While the risk is small, there is a risk that hyperplasia without atypia (as hers was) could progress to cancer without treatment.  After reviewing treatment options (we discussed attempt at repeat endometrial sampling in the OR versus definitive surgery), the patient voiced interest in proceeding with booking surgery.   Given extensive family history (although no hypercoag work-up that I can see), I will refer her back to hematology for any additional necessary work-up as well as recommendations regarding anti-coagulation in the peri-operative period. I would typically plan on 2-4 weeks of prophylactic anticoagulation in someone like the patient depending on whether surgery was robotic or open.  Will plan to get CBC and iron studies today. Depending on results, the patient may require additional iron infusions or blood transfusion prior to surgery. I will contact her with my recommendations. If iron transfusion necessary, this will results in some delay of her surgery.   Plan for surgery as we discussed today would be total hysterectomy and bilateral salpingectomy. If either ovary is abnormal in appearance, then I would decide about the need for unilateral oophorectomy at that time. I think that robotic assistance may be  feasible and discussed the advantages of this even though a larger incision would be necessary for specimen removal. She would like her Pfannenstiel incision used for this.   I will likely order an MRI A/P for pre-operative planning. Depending on findings, I will speak with interventional radiology about any utility of repeat [place] just prior to surgery to decrease surgical blood loss.   The patient will return for preoperative visit closer to the date of surgery in mid-November.   45 minutes of total time was spent for this patient encounter, including preparation, face-to-face counseling with the patient and coordination of care, and documentation of the encounter.  Jeral Pinch, MD  Division of Gynecologic Oncology  Department of Obstetrics and Gynecology  [facility]

## 2021-05-14 ENCOUNTER — Telehealth: Payer: Self-pay

## 2021-05-14 LAB — IRON AND TIBC
Iron: 18 ug/dL — ABNORMAL LOW (ref 41–142)
Saturation Ratios: 4 % — ABNORMAL LOW (ref 21–57)
TIBC: 404 ug/dL (ref 236–444)
UIBC: 386 ug/dL — ABNORMAL HIGH (ref 120–384)

## 2021-05-14 LAB — FERRITIN: Ferritin: <4 ng/mL — ABNORMAL LOW (ref 11–307)

## 2021-05-14 NOTE — Telephone Encounter (Signed)
Left message for Betty Gutierrez requesting return call. Re: Lab results and MRI appointment.

## 2021-05-14 NOTE — Telephone Encounter (Signed)
Received call from Betty Gutierrez, explained that her iron is 18 (low). Dr. Berline Lopes would like to delay surgery and set her up for iron transfusions. Referral has been placed and patient will receive call from the cancer center to schedule. Dr. Berline Lopes  would also like to obtain an MRI to aid in surgical planning. MRI has been scheduled for 05/25/21 at 0800. Patient is to arrive by 0730 and is to be NPO 6 hours prior to scan. Patient needs to have her MRI prior to receiving transfusion as this can affect the scan. Patient verbalized understanding. Instructed to call with any questions or concerns.

## 2021-05-15 ENCOUNTER — Other Ambulatory Visit: Payer: Self-pay | Admitting: Gynecologic Oncology

## 2021-05-15 DIAGNOSIS — D259 Leiomyoma of uterus, unspecified: Secondary | ICD-10-CM

## 2021-05-15 DIAGNOSIS — D500 Iron deficiency anemia secondary to blood loss (chronic): Secondary | ICD-10-CM

## 2021-05-19 ENCOUNTER — Telehealth: Payer: Self-pay | Admitting: Hematology and Oncology

## 2021-05-19 NOTE — Telephone Encounter (Signed)
Scheduled appointment per 10/21 referral. Patient is aware.

## 2021-05-21 ENCOUNTER — Other Ambulatory Visit: Payer: Self-pay

## 2021-05-21 ENCOUNTER — Telehealth: Payer: Self-pay | Admitting: *Deleted

## 2021-05-21 ENCOUNTER — Inpatient Hospital Stay (HOSPITAL_BASED_OUTPATIENT_CLINIC_OR_DEPARTMENT_OTHER): Payer: 59 | Admitting: Hematology and Oncology

## 2021-05-21 ENCOUNTER — Encounter: Payer: Self-pay | Admitting: Gynecologic Oncology

## 2021-05-21 ENCOUNTER — Inpatient Hospital Stay: Payer: 59

## 2021-05-21 VITALS — BP 126/85 | HR 94 | Temp 97.7°F | Resp 16 | Ht 64.0 in | Wt 198.3 lb

## 2021-05-21 DIAGNOSIS — D500 Iron deficiency anemia secondary to blood loss (chronic): Secondary | ICD-10-CM

## 2021-05-21 DIAGNOSIS — N8500 Endometrial hyperplasia, unspecified: Secondary | ICD-10-CM | POA: Diagnosis not present

## 2021-05-21 DIAGNOSIS — Z8249 Family history of ischemic heart disease and other diseases of the circulatory system: Secondary | ICD-10-CM

## 2021-05-21 LAB — CBC WITH DIFFERENTIAL (CANCER CENTER ONLY)
Abs Immature Granulocytes: 0.01 10*3/uL (ref 0.00–0.07)
Basophils Absolute: 0 10*3/uL (ref 0.0–0.1)
Basophils Relative: 1 %
Eosinophils Absolute: 0.1 10*3/uL (ref 0.0–0.5)
Eosinophils Relative: 2 %
HCT: 29 % — ABNORMAL LOW (ref 36.0–46.0)
Hemoglobin: 8.3 g/dL — ABNORMAL LOW (ref 12.0–15.0)
Immature Granulocytes: 0 %
Lymphocytes Relative: 44 %
Lymphs Abs: 2.2 10*3/uL (ref 0.7–4.0)
MCH: 19.3 pg — ABNORMAL LOW (ref 26.0–34.0)
MCHC: 28.6 g/dL — ABNORMAL LOW (ref 30.0–36.0)
MCV: 67.6 fL — ABNORMAL LOW (ref 80.0–100.0)
Monocytes Absolute: 0.4 10*3/uL (ref 0.1–1.0)
Monocytes Relative: 7 %
Neutro Abs: 2.3 10*3/uL (ref 1.7–7.7)
Neutrophils Relative %: 46 %
Platelet Count: 281 10*3/uL (ref 150–400)
RBC: 4.29 MIL/uL (ref 3.87–5.11)
RDW: 21.2 % — ABNORMAL HIGH (ref 11.5–15.5)
WBC Count: 4.9 10*3/uL (ref 4.0–10.5)
nRBC: 0 % (ref 0.0–0.2)

## 2021-05-21 LAB — CMP (CANCER CENTER ONLY)
ALT: 14 U/L (ref 0–44)
AST: 18 U/L (ref 15–41)
Albumin: 3.8 g/dL (ref 3.5–5.0)
Alkaline Phosphatase: 40 U/L (ref 38–126)
Anion gap: 10 (ref 5–15)
BUN: 9 mg/dL (ref 6–20)
CO2: 23 mmol/L (ref 22–32)
Calcium: 9 mg/dL (ref 8.9–10.3)
Chloride: 106 mmol/L (ref 98–111)
Creatinine: 0.77 mg/dL (ref 0.44–1.00)
GFR, Estimated: >60 mL/min (ref >60)
Glucose, Bld: 81 mg/dL (ref 70–99)
Potassium: 3.7 mmol/L (ref 3.5–5.1)
Sodium: 139 mmol/L (ref 135–145)
Total Bilirubin: 0.3 mg/dL (ref 0.3–1.2)
Total Protein: 7.4 g/dL (ref 6.5–8.1)

## 2021-05-21 LAB — RETIC PANEL
Immature Retic Fract: 16.9 % — ABNORMAL HIGH (ref 2.3–15.9)
RBC.: 4.21 MIL/uL (ref 3.87–5.11)
Retic Count, Absolute: 49.3 10*3/uL (ref 19.0–186.0)
Retic Ct Pct: 1.2 % (ref 0.4–3.1)
Reticulocyte Hemoglobin: 19.5 pg — ABNORMAL LOW (ref >27.9)

## 2021-05-21 NOTE — Progress Notes (Addendum)
Peer to peer performed for MRI of the abdomen and pelvis. The physician reviewer stated the abdominal imaging would not be approved but this could be appealed if needed. Approval of the MRI pelvis was obtained: # [id number] expires 07/05/2021.

## 2021-05-21 NOTE — Progress Notes (Signed)
[place] [phone]   [phone] NOTE  Patient Care Team: Servando Salina, MD as PCP - General (Obstetrics and Gynecology)  Hematological/Oncological History # Iron Deficiency Anemia 2/2 to GYN Bleeding 05/13/2021: WBC 4.6, Hgb 8.2, MCV 68.6, Plt 319. Ferritin <4 and Iron sat 4% 05/21/2021: establish care with Dr. Lorenso Courier   #Family History of Venous Thromboembolism 07/01/2017: last seen by Dr. Lebron Conners at the [facility]. Recommend Xarelto 63m PO with travel due to history of VTEs with mother, maternal aunt, and 2 sisters.   CHIEF COMPLAINTS/PURPOSE OF CONSULTATION:  "Iron Deficiency Anemia "  HISTORY OF PRESENTING ILLNESS:  Betty Defreitas50y.o. female with medical history significant for uterine fibroids who presents for evaluation of iron deficiency anemia.   On review of the previous records Betty Gutierrez was last seen in our clinic on 07/01/2017 due to concern for strong family history of venous thromboembolism.  At that time was recommended that she take Xarelto 10 mg p.o. prior to travel.  Most recently on 05/13/48 the patient had blood studies which showed hemoglobin 8.2, MCV 68.6, ferritin of less than 4, iron sat of 4%.  She was referred to hematology for evaluation and management increasing her hemoglobin prior to GYN surgery.  On exam today Betty Gutierrez notes that she is deeply concerned about her family history of clotting.  She is previously seen by Dr. PLebron Connersbut was unsure about the results of his visit.  [place] has a [place].  She was prescribed Xarelto therapy starter kit, but looking back usually recommend that she take 10 mg on the day of travel.  On further discussion she notes that she does have heavy menstrual cycles which generally occur at regular 28-day cycles.  She notes that on day 1-2 her cycles are particularly heavy going through  7-8 tampons and 4-5 pads which are completely soaked.  She has been on p.o. iron therapy since at least February 05, 2021 with only modest bump in her hemoglobin from 7.6 up to 8.2.  She also notes that she underwent 10 iron inf[facility] back [facility]  This was prior to her fibroid embolization.  She notes that she does not have any restrictive diet but typically only eats red meat once every 2 weeks or so.  She prefers chicken or fish.  She denies having any overt sources of bleeding such as nosebleeds, gum bleeding, or blood in her stool.  She does have dark stool with her p.o. iron therapy.  Her family history is remarkable for VTE in her mother, mother's twin sister, and her 2 sisters.  She has that she is a never smoker but drinks about 1 to 2 glasses of wine per week.  She currently works in a[employer] photographer  A full 10 point ROS is listed below.  MEDICAL HISTORY:  Past Medical History:  Diagnosis Date   Family history of pulmonary embolism 07/31/2017    SURGICAL HISTORY: Past Surgical History:  Procedure Laterality Date   Cesarian  N/A 2004   MYOMECTOMY N/A 2000   uterine fibroid emoblization  N/A 2014    SOCIAL HISTORY: Social History   Socioeconomic History   Marital status: Divorced    Spouse name: Not on file   Number of children: Not on file   Years of education: Not on file   Highest education level: Not  on file  Occupational History   Not on file  Tobacco Use   Smoking status: Never   Smokeless tobacco: Never  Substance and Sexual Activity   Alcohol use: Yes    Alcohol/week: 1.0 standard drink    Types: 1 Glasses of wine per week   Drug use: No   Sexual activity: Not Currently  Other Topics Concern   Not on file  Social History Narrative   Not on file   Social Determinants of Health   Financial Resource Strain: Not on file  Food Insecurity: Not on file  Transportation Needs: Not on file  Physical Activity: Not on file  Stress: Not on file   Social Connections: Not on file  Intimate Partner Violence: Not on file    FAMILY HISTORY: Family History  Problem Relation Age of Onset   Pulmonary embolism Mother        following international flight   Breast cancer Mother    Prostate cancer Father    Pulmonary embolism Sister    Pulmonary embolism Sister    Pulmonary embolism Maternal Aunt        Twin of the patient's mother, also occurred following and international flight   Colon cancer Neg Hx    Ovarian cancer Neg Hx    Endometrial cancer Neg Hx    Pancreatic cancer Neg Hx     ALLERGIES:  is allergic to hydromorphone.  MEDICATIONS:  Current Outpatient Medications  Medication Sig Dispense Refill   iron polysaccharides (NIFEREX) 150 MG capsule Take 150 mg by mouth daily.     Rivaroxaban 15 & 20 MG TBPK Take as directed on package: Start with one 90m tablet by mouth twice a day with food. On Day 22, switch to one 256mtablet once a day with food. (Patient not taking: Reported on 05/13/2021) 51 each 0   No current facility-administered medications for this visit.    REVIEW OF SYSTEMS:   Constitutional: ( - ) fevers, ( - )  chills , ( - ) night sweats Eyes: ( - ) blurriness of vision, ( - ) double vision, ( - ) watery eyes Ears, nose, mouth, throat, and face: ( - ) mucositis, ( - ) sore throat Respiratory: ( - ) cough, ( - ) dyspnea, ( - ) wheezes Cardiovascular: ( - ) palpitation, ( - ) chest discomfort, ( - ) lower extremity swelling Gastrointestinal:  ( - ) nausea, ( - ) heartburn, ( - ) change in bowel habits Skin: ( - ) abnormal skin rashes Lymphatics: ( - ) new lymphadenopathy, ( - ) easy bruising Neurological: ( - ) numbness, ( - ) tingling, ( - ) new weaknesses Behavioral/Psych: ( - ) mood change, ( - ) new changes  All other systems were reviewed with the patient and are negative.  PHYSICAL EXAMINATION: ECOG PERFORMANCE STATUS: 0 - Asymptomatic  Vitals:   05/21/21 1546  BP: 126/85  Pulse: 94  Resp: 16   Temp: 97.7 F (36.5 C)  SpO2: 100%   Filed Weights   05/21/21 1546  Weight: 198 lb 4.8 oz (89.9 kg)    GENERAL: well appearing young African-American female in NAD  SKIN: skin color, texture, turgor are normal, no rashes or significant lesions EYES: conjunctiva are pink and non-injected, sclera clear LUNGS: clear to auscultation and percussion with normal breathing effort HEART: regular rate & rhythm and no murmurs and no lower extremity edema Musculoskeletal: no cyanosis of digits and no clubbing  PSYCH: alert &  oriented x 3, fluent speech NEURO: no focal motor/sensory deficits  LABORATORY DATA:  I have reviewed the data as listed CBC Latest Ref Rng & Units 05/21/2021 05/13/2021  WBC 4.0 - 10.5 K/uL 4.9 4.6  Hemoglobin 12.0 - 15.0 g/dL 8.3(L) 8.2(L)  Hematocrit 36.0 - 46.0 % 29.0(L) 28.9(L)  Platelets 150 - 400 K/uL 281 319    No flowsheet data found.  RADIOGRAPHIC STUDIES: No results found.  ASSESSMENT & PLAN Betty Gutierrez 50 y.o. female with medical history significant for uterine fibroids who presents for evaluation of iron deficiency anemia.   After review of the labs, review of the records, and discussion with the patient the patients findings are most consistent with iron deficiency anemia secondary to GYN bleeding.  The patient has known fibroids has markedly heavy menstrual cycles.  She is currently taking p.o. iron therapy but this has failed to adequately replete her iron stores.  As such would recommend we pursue IV iron therapy in hopes of bolstering her hemoglobin greater than 11.0 prior to her procedure.  Of note the patient does have a strong family history of venous thromboembolism and has requested a hypercoagulation work-up.  I noted that this would not change management but she was insistent that she would like to pursue this evaluation.  We will order coagulation studies today in order to further assess.  # Iron Deficiency Anemia 2/2 to GYN  Bleeding -- Findings are consistent with iron deficiency anemia secondary to patient's menorrhagia --referred to increase Hgb to >11.0 prior to surgery. Under the care of Dr. Berline Lopes with Gyn/Onc  --We will confirm iron deficiency anemia by ordering iron panel and ferritin as well as reticulocytes, CBC, and CMP --Continue OTC PO iron therapy daily with a source of vitamin C --We will plan to proceed with IV iron therapy in order to help bolster the patient's blood counts. Scheduling with [facility] infusion center.  --Plan for return to clinic in 4 to 6 weeks time after last dose of IV iron  #Family History of Venous Thromboembolism --patient notes she has a strong desire for thrombophilia testing given her family history.  --I think this is a reasonable option given her frequent traveling. Will order standard hypercoagulation studies.  --I do not recommend Xarelto as thromboprophylaxis in patients with no personal history of VTE --will calculate Caprini Score to determine VTE prevention after upcoming surgery.   Orders Placed This Encounter  Procedures   CBC with Differential ([place] Only)    Standing Status:   Future    Number of Occurrences:   1    Standing Expiration Date:   05/21/2022   CMP ([employer] only)    Standing Status:   Future    Number of Occurrences:   1    Standing Expiration Date:   05/21/2022   Ferritin    Standing Status:   Future    Number of Occurrences:   1    Standing Expiration Date:   05/21/2022   Iron and TIBC    Standing Status:   Future    Number of Occurrences:   1    Standing Expiration Date:   05/21/2022   Retic Panel    Standing Status:   Future    Number of Occurrences:   1    Standing Expiration Date:   05/21/2022   Cardiolipin antibodies, IgG, IgM, IgA*    Standing Status:   Future    Number of Occurrences:   1  Standing Expiration Date:   05/21/2022   Beta-2-glycoprotein i abs, IgG/M/A    Standing Status:   Future    Number  of Occurrences:   1    Standing Expiration Date:   05/21/2022   PROTEIN S PANEL, Total, Free, Functional Protein S    Standing Status:   Future    Number of Occurrences:   1    Standing Expiration Date:   05/21/2022   Protein C, total*    Standing Status:   Future    Number of Occurrences:   1    Standing Expiration Date:   05/21/2022   Lupus anticoagulant panel*    Standing Status:   Future    Number of Occurrences:   1    Standing Expiration Date:   05/21/2022   Prothrombin gene mutation*    Standing Status:   Future    Number of Occurrences:   1    Standing Expiration Date:   05/21/2022    All questions were answered. The patient knows to call the clinic with any problems, questions or concerns.  A total of more than 60 minutes were spent on this encounter with face-to-face time and non-face-to-face time, including preparing to see the patient, ordering tests and/or medications, counseling the patient and coordination of care as outlined above.   Ledell Peoples, MD Department of Hematology/Oncology [place] at [facility] Phone: [phone] Pager: [phone] Email: [email or web address]  05/21/2021 4:45 PM

## 2021-05-21 NOTE — Telephone Encounter (Signed)
Fax records and surgical optimization form to patient's PCP, cardiology and medical oncology offices

## 2021-05-22 ENCOUNTER — Other Ambulatory Visit: Payer: Self-pay | Admitting: Pharmacy Technician

## 2021-05-22 ENCOUNTER — Telehealth: Payer: Self-pay | Admitting: Pharmacy Technician

## 2021-05-22 LAB — IRON AND TIBC
Iron: 19 ug/dL — ABNORMAL LOW (ref 41–142)
Saturation Ratios: 5 % — ABNORMAL LOW (ref 21–57)
TIBC: 389 ug/dL (ref 236–444)
UIBC: 370 ug/dL (ref 120–384)

## 2021-05-22 LAB — BETA-2-GLYCOPROTEIN I ABS, IGG/M/A
Beta-2 Glyco I IgG: <9 GPI IgG units (ref 0–20)
Beta-2-Glycoprotein I IgA: <9 GPI IgA units (ref 0–25)
Beta-2-Glycoprotein I IgM: <9 GPI IgM units (ref 0–32)

## 2021-05-22 LAB — FERRITIN: Ferritin: 4 ng/mL — ABNORMAL LOW (ref 11–307)

## 2021-05-22 NOTE — Telephone Encounter (Signed)
We will change the therapy plan to venofer and schedule the patient as soon as possible.  Ty.

## 2021-05-22 NOTE — Telephone Encounter (Signed)
OK to proceed with Venofer 200mg  IV every 7 days x 5 doses

## 2021-05-22 NOTE — Telephone Encounter (Signed)
Dr. Lorenso Courier,  Auth Submission: DENIED -Shirlean Kelly Payer: [facility] Medication & CPT/J Code(s) submitted: Feraheme (ferumoxytol) [phone] Route of submission (phone, fax, portal): PORTAL Auth type: Buy/Bill  Denied due to patient has not tried and or failed step therapy.  VENOFER INFED FERRLICIT.  Would you like to try to Venofer?? Please advise.  Ty.

## 2021-05-23 LAB — PROTEIN S PANEL
Protein S Activity: 48 % — ABNORMAL LOW (ref 63–140)
Protein S Ag, Free: 71 % (ref 61–136)
Protein S Ag, Total: 105 % (ref 60–150)

## 2021-05-23 LAB — LUPUS ANTICOAGULANT PANEL
DRVVT: 36.3 s (ref 0.0–47.0)
PTT Lupus Anticoagulant: 36.2 s (ref 0.0–51.9)

## 2021-05-24 LAB — CARDIOLIPIN ANTIBODIES, IGG, IGM, IGA
Anticardiolipin IgA: <9 APL U/mL (ref 0–11)
Anticardiolipin IgG: <9 GPL U/mL (ref 0–14)
Anticardiolipin IgM: 15 MPL U/mL — ABNORMAL HIGH (ref 0–12)

## 2021-05-25 ENCOUNTER — Ambulatory Visit (HOSPITAL_COMMUNITY)
Admission: RE | Admit: 2021-05-25 | Discharge: 2021-05-25 | Disposition: A | Discharge status: Home / Self Care | Payer: 59 | Source: Ambulatory Visit | Attending: Gynecologic Oncology | Admitting: Gynecologic Oncology

## 2021-05-25 ENCOUNTER — Ambulatory Visit (HOSPITAL_COMMUNITY): Payer: 59

## 2021-05-25 ENCOUNTER — Ambulatory Visit (HOSPITAL_COMMUNITY): Admission: RE | Admit: 2021-05-25 | Payer: 59 | Source: Ambulatory Visit

## 2021-05-25 DIAGNOSIS — R140 Abdominal distension (gaseous): Secondary | ICD-10-CM

## 2021-05-25 DIAGNOSIS — R1900 Intra-abdominal and pelvic swelling, mass and lump, unspecified site: Secondary | ICD-10-CM

## 2021-05-25 DIAGNOSIS — D259 Leiomyoma of uterus, unspecified: Secondary | ICD-10-CM | POA: Insufficient documentation

## 2021-05-25 LAB — PROTEIN C, TOTAL: Protein C, Total: 101 % (ref 60–150)

## 2021-05-25 IMAGING — MR MR PELVIS WO/W CM
22 of 23 series · 46 of 48 positions shown · IV contrast (gadavist)
Comparison: None.  An outside MRI of [DATE] is not available.

CLINICAL DATA: Abdominal mass. Intra-abdominal neoplasm suspected.
Evaluate uterine fibroid. Abdominal distension.

EXAM:
MRI ABDOMEN AND PELVIS WITHOUT AND WITH CONTRAST
TECHNIQUE: Multiplanar multisequence MR imaging of the abdomen and pelvis was
performed both before and after the administration of intravenous
contrast.
CONTRAST:  9.5mL GADAVIST GADOBUTROL 1 MMOL/ML IV SOLN

[Series 3: T2 · coronal · 6.0mm · 1.56mm/px · 2 of 30 slices shown (1 of 4)]
[im 1/30]
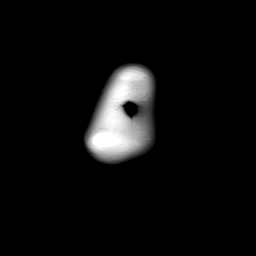
[im 30/30]
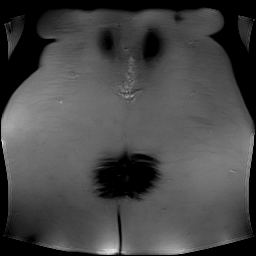

[Series 4: T1 · axial · 4.0mm · 0.88mm/px · z∈[+92,+328]mm · 3 of 60 slices shown (1 of 4)]
[im 1/60]
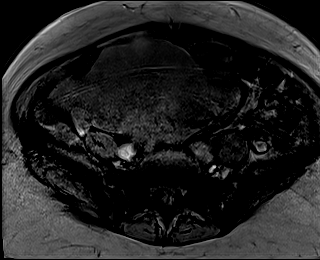
[im 30/60]
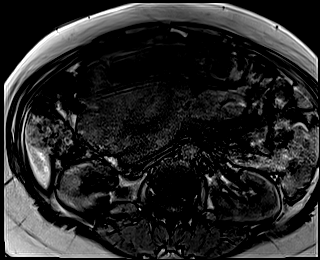
[im 60/60]
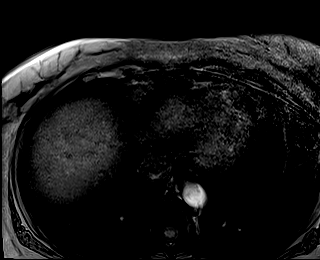

[Series 5: T1 · axial · 4.0mm · 0.88mm/px · z∈[+92,+328]mm · 2 of 60 slices shown (2 of 4)]
[im 1/60]
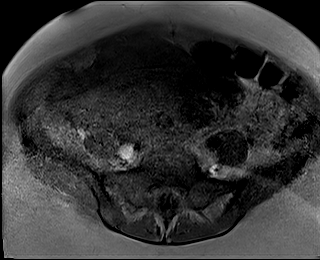
[im 60/60]
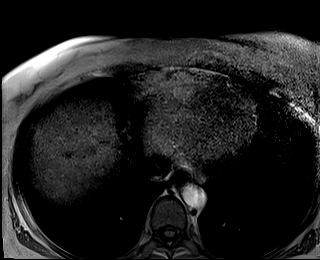

[Series 6: T1 · axial · 4.0mm · 0.88mm/px · z∈[-111,+125]mm · 2 of 60 slices shown (3 of 4)]
[im 1/60]
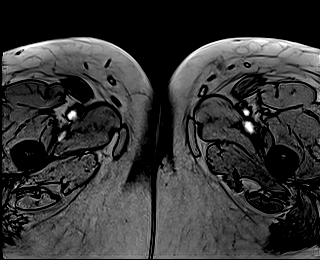
[im 60/60]
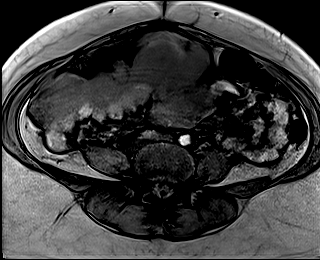

[Series 7: T1 · axial · 4.0mm · 0.88mm/px · z∈[-111,+125]mm · 2 of 60 slices shown (4 of 4)]
[im 1/60]
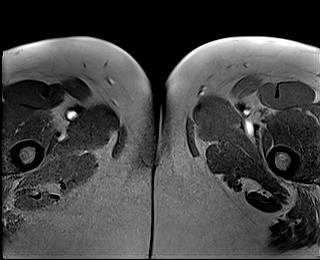
[im 60/60]
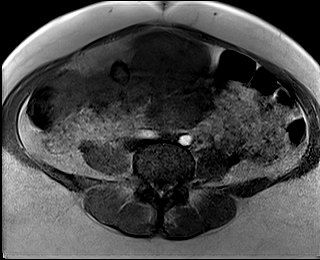

[Series 8: T2 · sagittal · 5.0mm · 0.74mm/px · 1 of 40 slices shown (2 of 4)]
[im 1/40]
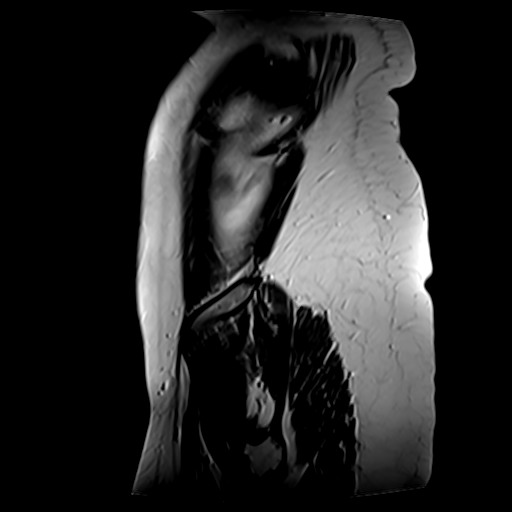

[Series 9: axial t2_upper · axial · 5.0mm · 0.57mm/px · 1 of 34 slices shown]
[im 1/34]
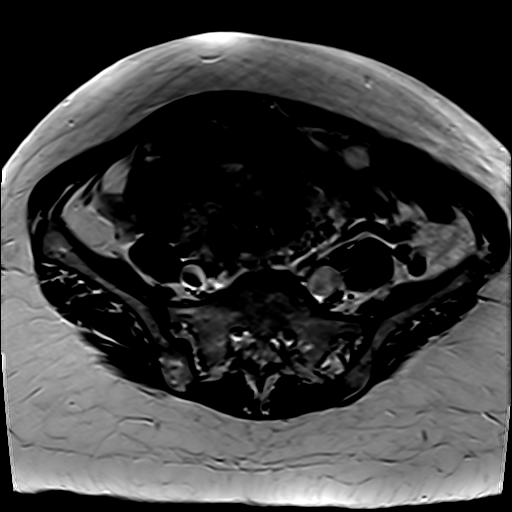

[Series 10: axial t2_lower · axial · 5.0mm · 0.57mm/px · 1 of 34 slices shown]
[im 1/34]
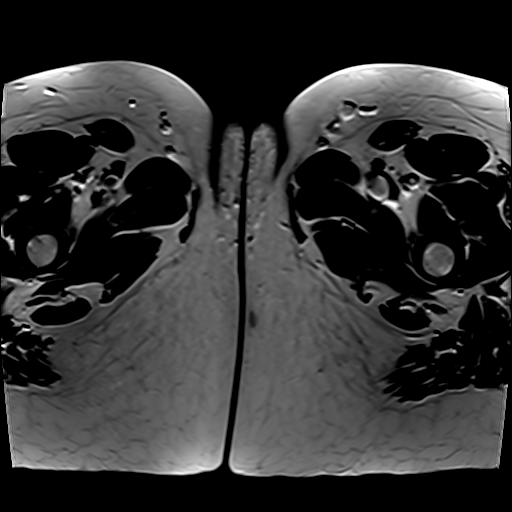

[Series 11: T2 · axial · 5.0mm · 0.57mm/px · 1 of 34 slices shown (3 of 4)]
[im 1/34]
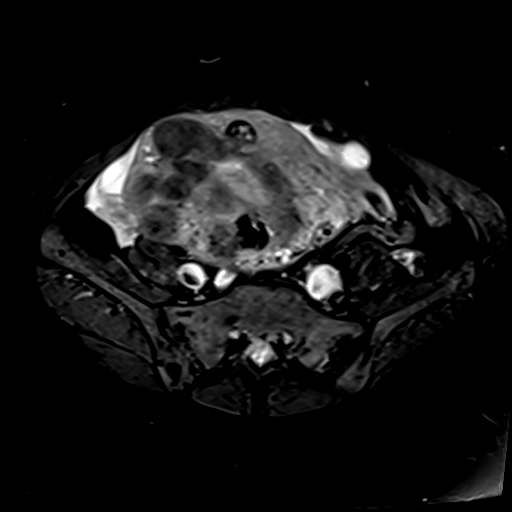

[Series 12: T2 · axial · 5.0mm · 0.57mm/px · 1 of 34 slices shown (4 of 4)]
[im 1/34]
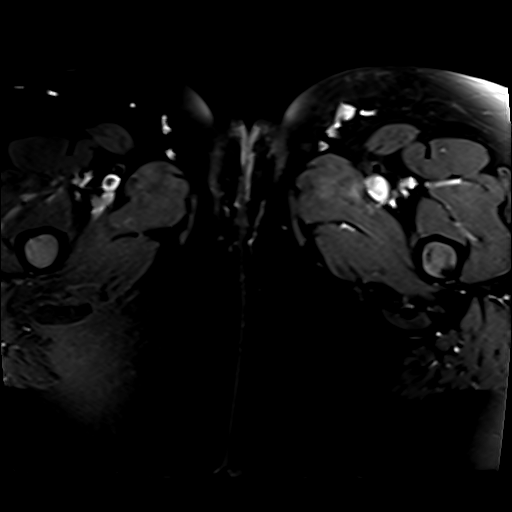

[Series 13: DWI · axial · 8.0mm · 2.90mm/px · z∈[-93,+254]mm · 3 of 96 slices shown (1 of 3)]
[im 1/96]
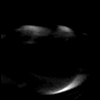
[im 48/96]
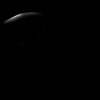
[im 96/96]
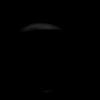

[Series 14: DWI · axial · 8.0mm · 2.90mm/px · 1 of 32 slices shown (2 of 3)]
[im 1/32]
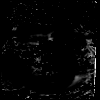

[Series 15: DWI · axial · 8.0mm · 2.90mm/px · 1 of 32 slices shown (3 of 3)]
[im 1/32]
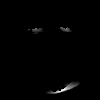

[Series 17: T1 dynamic · axial · 3.5mm · 0.91mm/px · z∈[-77,+284]mm · 3 of 104 slices shown (1 of 7)]
[im 1/104]
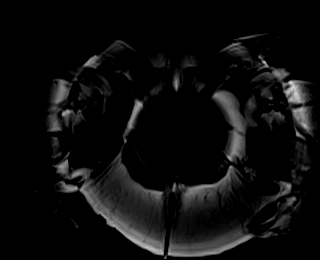
[im 52/104]
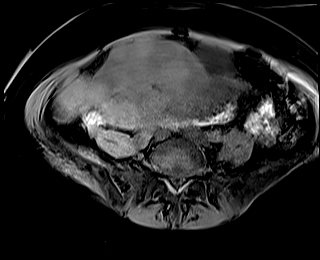
[im 104/104]
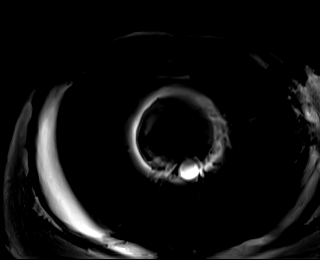

[Series 20: T1 dynamic · axial · 3.5mm · 0.91mm/px · z∈[-77,+284]mm · 3 of 104 slices shown (2 of 7)]
[im 1/104]
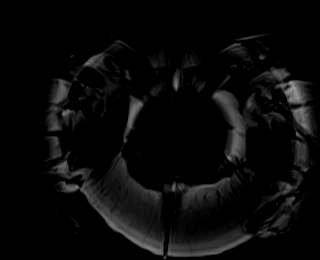
[im 52/104]
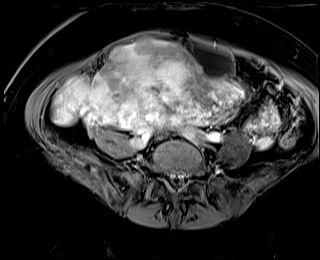
[im 104/104]
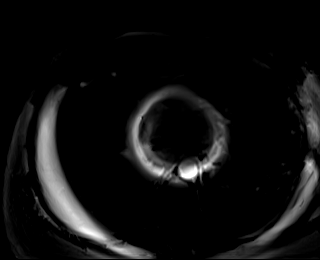

[Series 21: T1 dynamic · axial · 3.5mm · 0.91mm/px · z∈[-77,+284]mm · 3 of 104 slices shown (3 of 7)]
[im 1/104]
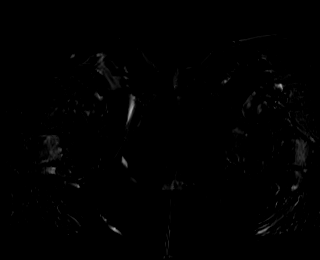
[im 52/104]
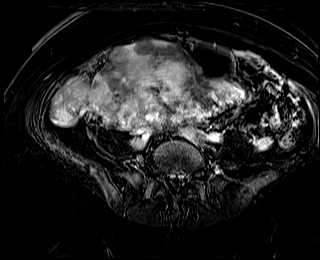
[im 104/104]
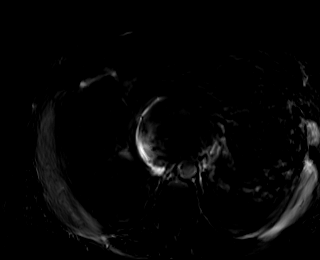

[Series 24: T1 dynamic · axial · 3.5mm · 0.91mm/px · z∈[-77,+284]mm · 3 of 104 slices shown (4 of 7)]
[im 1/104]
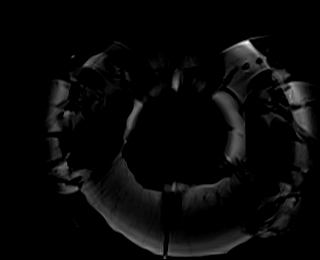
[im 52/104]
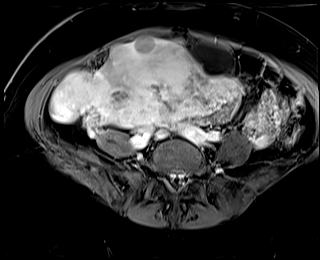
[im 104/104]
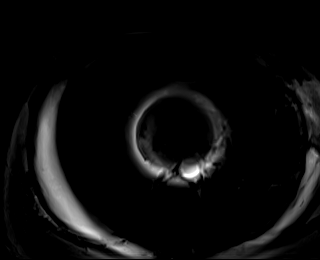

[Series 25: T1 dynamic · axial · 3.5mm · 0.91mm/px · z∈[-77,+284]mm · 3 of 104 slices shown (5 of 7)]
[im 1/104]
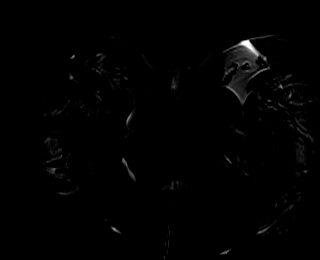
[im 52/104]
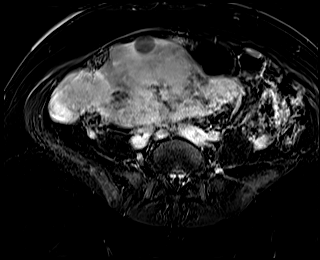
[im 104/104]
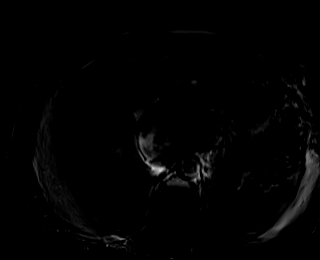

[Series 28: T1 dynamic · axial · 3.5mm · 0.91mm/px · z∈[-77,+284]mm · 3 of 104 slices shown (6 of 7)]
[im 1/104]
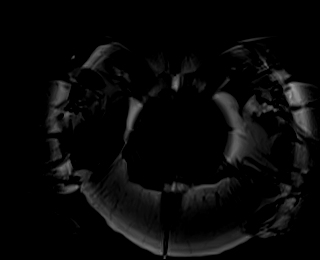
[im 52/104]
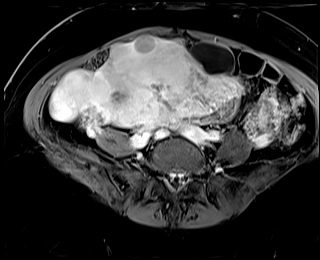
[im 104/104]
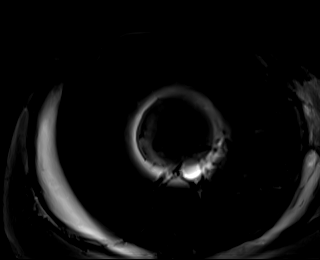

[Series 29: T1 dynamic · axial · 3.5mm · 0.91mm/px · z∈[-77,+284]mm · 3 of 104 slices shown (7 of 7)]
[im 1/104]
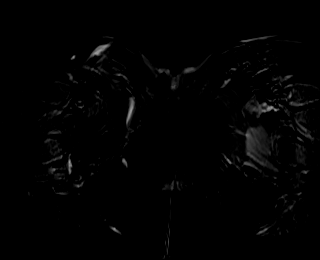
[im 52/104]
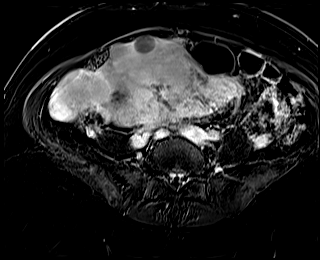
[im 104/104]
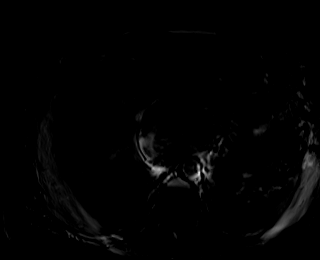

[Series 31: T1 dynamic post-contrast · axial · 3.5mm · 1.06mm/px · z∈[-70,+318]mm · 3 of 112 slices shown (1 of 2)]
[im 1/112]
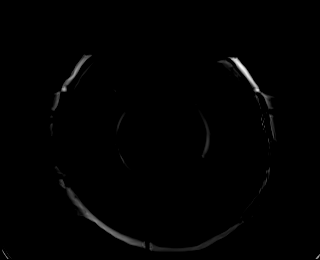
[im 56/112]
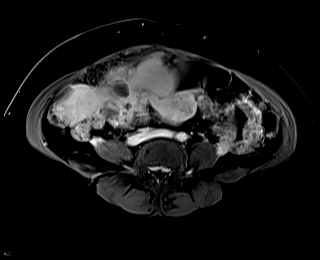
[im 112/112]
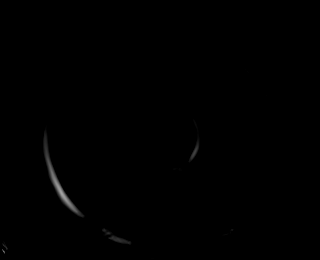

[Series 33: T1 dynamic post-contrast · sagittal · 3.0mm · 1.25mm/px · 1 of 80 slices shown (2 of 2)]
[im 1/80]
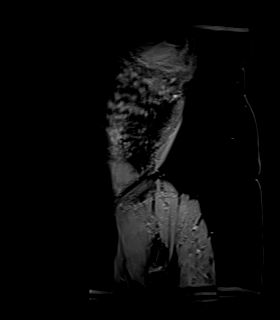

[46 of 48 positions shown; findings below may reference images not displayed]

FINDINGS: COMBINED FINDINGS FOR BOTH MR ABDOMEN AND PELVIS

Portions of the upper abdomen are excluded on this exam that was
performed to evaluate the uterus and the extent of uterine extension
into the abdomen.

HepatObiliary: normal imaged liver and gallbladder. No be
intrahepatic biliary duct dilatation.

Pancreas:  Normal imaged pancreas.

Spleen: Normal imaged spleen, without splenomegaly.

Adrenals/Urinary Tract: Normal adrenal glands. Normal kidneys,
without hydronephrosis. Normal urinary bladder.

Stomach/Bowel: Stomach excluded. Normal caliber of abdominal bowel
loops.

Vascular/Lymphatic: Normal lower abdominal aortic caliber. No pelvic
sidewall aneurysm. No lower abdominal or pelvic sidewall adenopathy.

Reproductive: Marked enlargement of the uterus, with innumerable
masses within. For example, the uterus measures 26.1 x 9.3 by
cm (volume = [M6] cm^3), including on 38/33 and 70/31. On the [M6]
ultrasound, the uterus measured 16.6 x 14.0 x 9.0 cm. (Volume = [M6]
cm^3). The endometrium is displaced to the left, but normal in
caliber where imaged.

The abdominal extension of uterine enlargement is seen to the
craniocaudal level of the SMA. A dominant lower uterine segment
nonenhancing mass measures 6.1 by 6.1 cm on 83/31.

The right ovary may be identified on [DATE]. The left ovary may be
identified anteriorly on [DATE]. Suboptimally evaluated, without
gross adnexal Mass.

Other: Trace free pelvic fluid is likely physiologic. No abdominal
ascites.

Fluid signal nonenhancing lesion posterior to the ascending colon
measures 2.3 x 1.8 cm on [DATE].

Musculoskeletal: No acute osseous abnormality.
IMPRESSION: 1. Massive enlargement of the uterus, with innumerable masses
within. These all likely represent fibroids. Uterine volume increase
of nearly 100% since the ultrasound of [DATE].
2. No hydronephrosis or other acute complication.
3. Nonenhancing T2 hyperintense lesion posterior to the ascending
colon, likely a lymphangioma or mesenteric cyst.

## 2021-05-25 IMAGING — MR MR ABDOMEN WO/W CM
22 of 23 series · 46 of 48 positions shown · IV contrast (GADAVIST)
Comparison: None.  An outside MRI of [DATE] is not available.

CLINICAL DATA: Abdominal mass. Intra-abdominal neoplasm suspected.
Evaluate uterine fibroid. Abdominal distension.

EXAM:
MRI ABDOMEN AND PELVIS WITHOUT AND WITH CONTRAST
TECHNIQUE: Multiplanar multisequence MR imaging of the abdomen and pelvis was
performed both before and after the administration of intravenous
contrast.
CONTRAST:  9.5mL GADAVIST GADOBUTROL 1 MMOL/ML IV SOLN

[Series 3: T2 · coronal · 6.0mm · 1.56mm/px · 2 of 30 slices shown (1 of 4)]
[im 1/30]
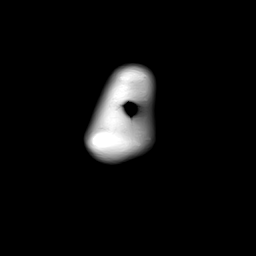
[im 30/30]
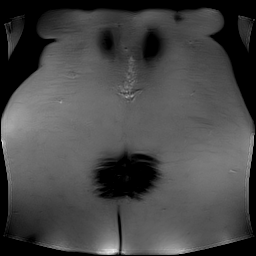

[Series 4: T1 · axial · 4.0mm · 0.88mm/px · z∈[+92,+328]mm · 3 of 60 slices shown (1 of 4)]
[im 1/60]
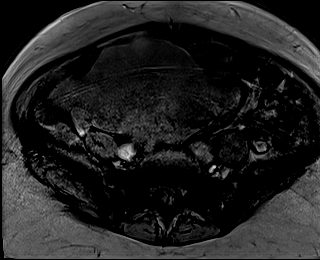
[im 30/60]
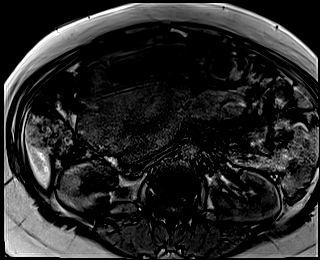
[im 60/60]
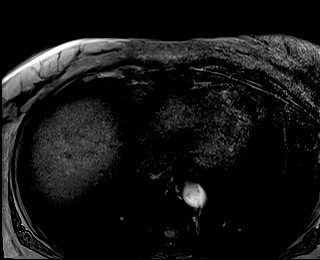

[Series 5: T1 · axial · 4.0mm · 0.88mm/px · z∈[+92,+328]mm · 2 of 60 slices shown (2 of 4)]
[im 1/60]
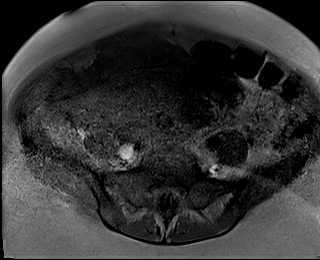
[im 60/60]
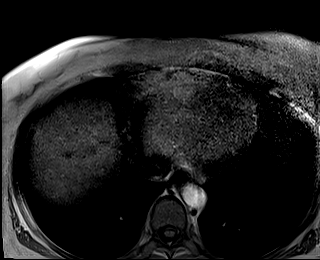

[Series 6: T1 · axial · 4.0mm · 0.88mm/px · z∈[-111,+125]mm · 2 of 60 slices shown (3 of 4)]
[im 1/60]
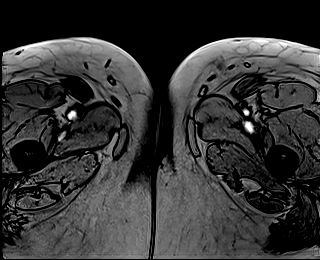
[im 60/60]
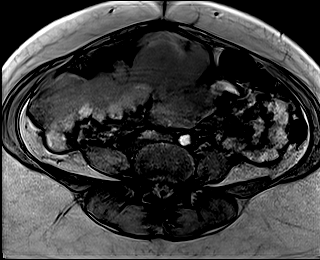

[Series 7: T1 · axial · 4.0mm · 0.88mm/px · z∈[-111,+125]mm · 2 of 60 slices shown (4 of 4)]
[im 1/60]
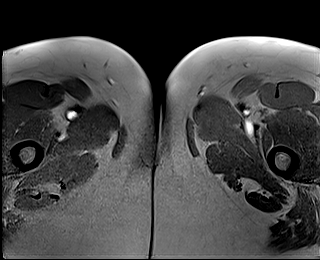
[im 60/60]
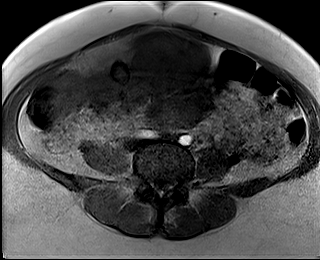

[Series 8: T2 · sagittal · 5.0mm · 0.74mm/px · 1 of 40 slices shown (2 of 4)]
[im 1/40]
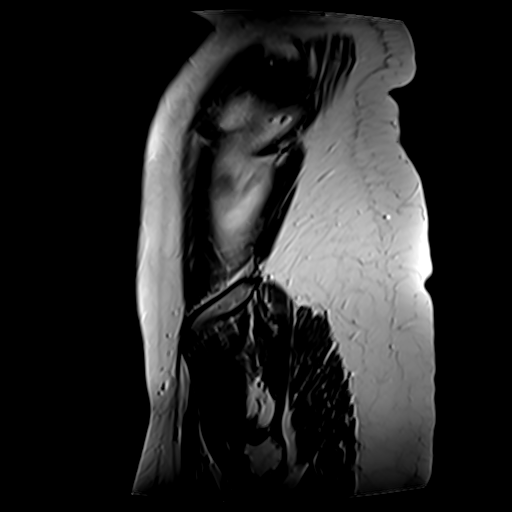

[Series 9: axial t2_upper · axial · 5.0mm · 0.57mm/px · 1 of 34 slices shown]
[im 1/34]
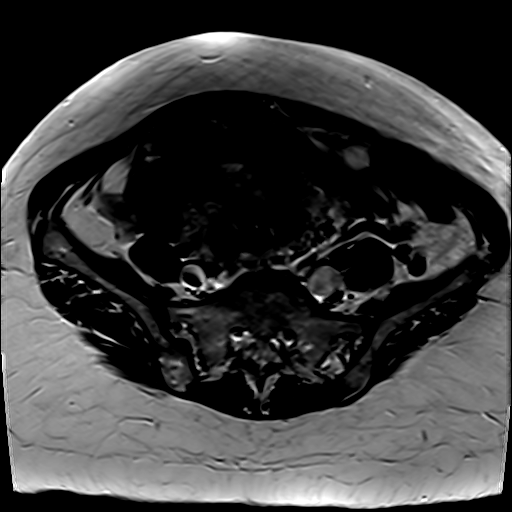

[Series 10: axial t2_lower · axial · 5.0mm · 0.57mm/px · 1 of 34 slices shown]
[im 1/34]
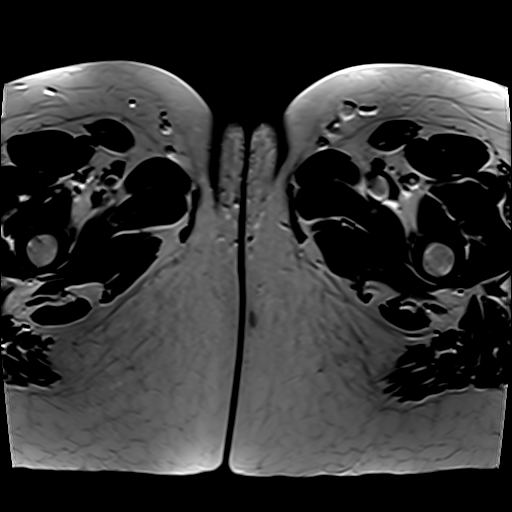

[Series 11: T2 · axial · 5.0mm · 0.57mm/px · 1 of 34 slices shown (3 of 4)]
[im 1/34]
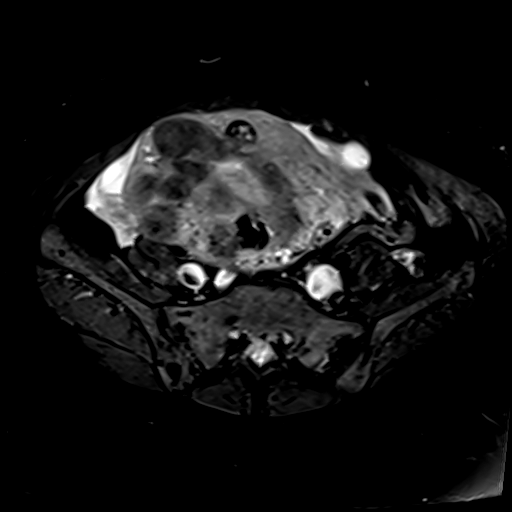

[Series 12: T2 · axial · 5.0mm · 0.57mm/px · 1 of 34 slices shown (4 of 4)]
[im 1/34]
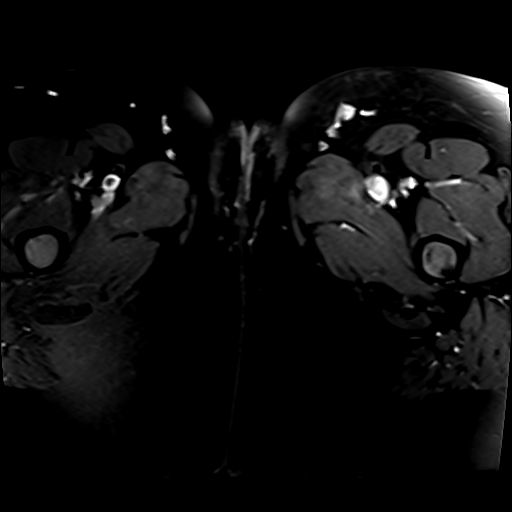

[Series 13: DWI · axial · 8.0mm · 2.90mm/px · z∈[-93,+254]mm · 3 of 96 slices shown (1 of 3)]
[im 1/96]
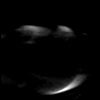
[im 48/96]
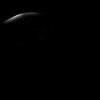
[im 96/96]
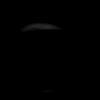

[Series 14: DWI · axial · 8.0mm · 2.90mm/px · 1 of 32 slices shown (2 of 3)]
[im 1/32]
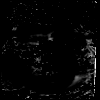

[Series 15: DWI · axial · 8.0mm · 2.90mm/px · 1 of 32 slices shown (3 of 3)]
[im 1/32]
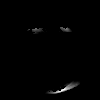

[Series 17: T1 dynamic · axial · 3.5mm · 0.91mm/px · z∈[-77,+284]mm · 3 of 104 slices shown (1 of 7)]
[im 1/104]
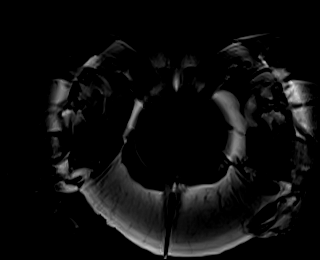
[im 52/104]
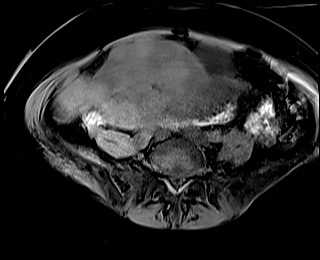
[im 104/104]
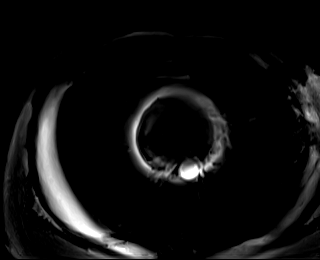

[Series 20: T1 dynamic · axial · 3.5mm · 0.91mm/px · z∈[-77,+284]mm · 3 of 104 slices shown (2 of 7)]
[im 1/104]
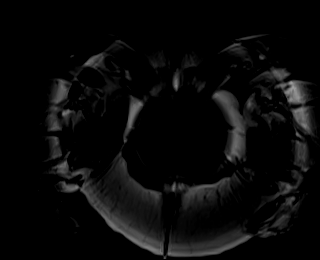
[im 52/104]
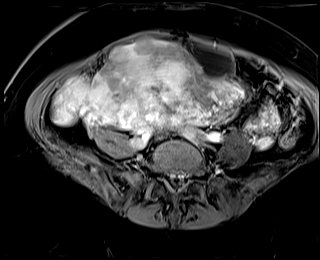
[im 104/104]
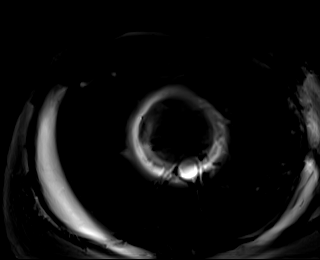

[Series 21: T1 dynamic · axial · 3.5mm · 0.91mm/px · z∈[-77,+284]mm · 3 of 104 slices shown (3 of 7)]
[im 1/104]
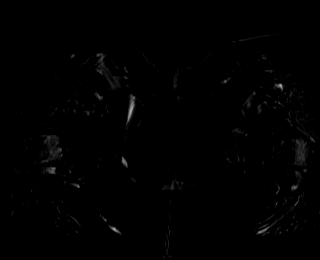
[im 52/104]
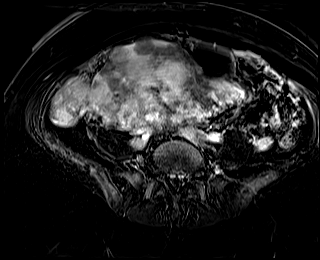
[im 104/104]
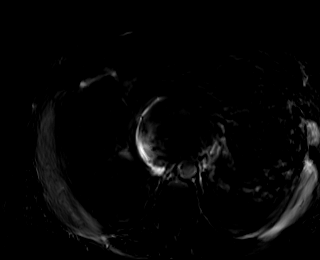

[Series 24: T1 dynamic · axial · 3.5mm · 0.91mm/px · z∈[-77,+284]mm · 3 of 104 slices shown (4 of 7)]
[im 1/104]
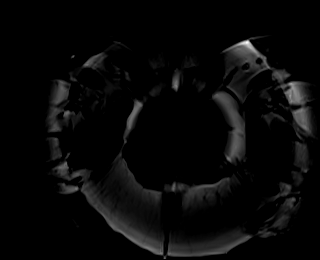
[im 52/104]
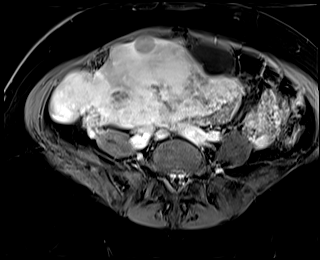
[im 104/104]
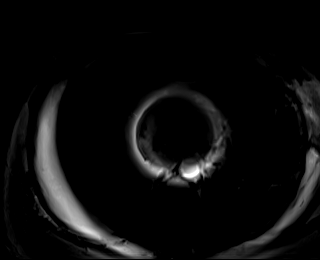

[Series 25: T1 dynamic · axial · 3.5mm · 0.91mm/px · z∈[-77,+284]mm · 3 of 104 slices shown (5 of 7)]
[im 1/104]
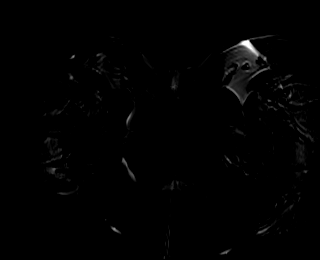
[im 52/104]
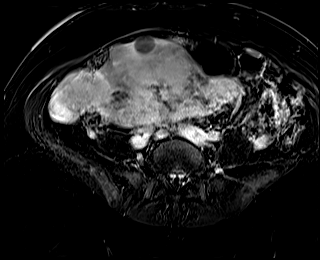
[im 104/104]
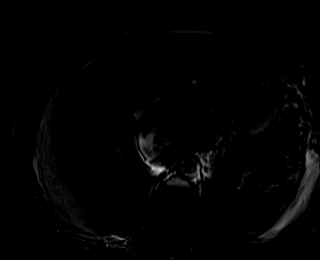

[Series 28: T1 dynamic · axial · 3.5mm · 0.91mm/px · z∈[-77,+284]mm · 3 of 104 slices shown (6 of 7)]
[im 1/104]
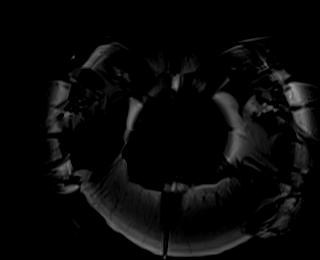
[im 52/104]
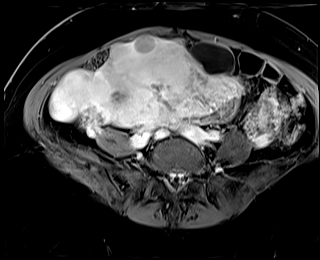
[im 104/104]
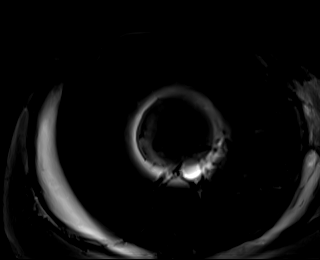

[Series 29: T1 dynamic · axial · 3.5mm · 0.91mm/px · z∈[-77,+284]mm · 3 of 104 slices shown (7 of 7)]
[im 1/104]
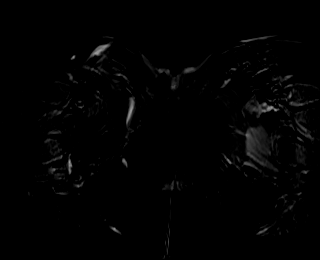
[im 52/104]
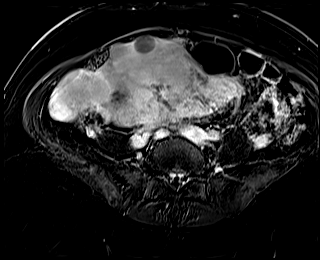
[im 104/104]
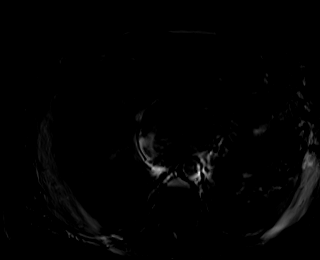

[Series 31: T1 dynamic post-contrast · axial · 3.5mm · 1.06mm/px · z∈[-70,+318]mm · 3 of 112 slices shown (1 of 2)]
[im 1/112]
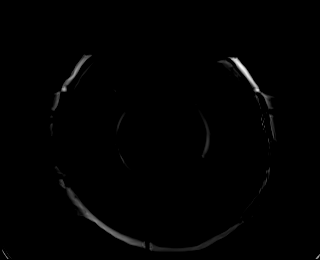
[im 56/112]
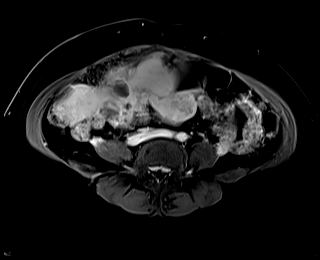
[im 112/112]
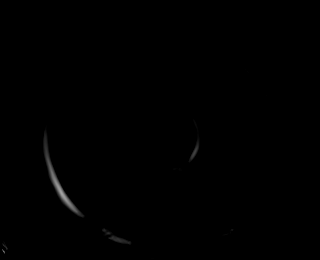

[Series 33: T1 dynamic post-contrast · sagittal · 3.0mm · 1.25mm/px · 1 of 80 slices shown (2 of 2)]
[im 1/80]
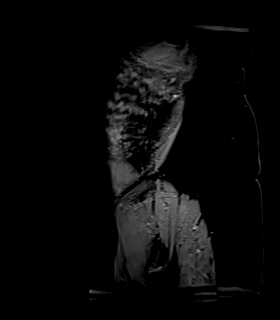

[46 of 48 positions shown; findings below may reference images not displayed]

FINDINGS: COMBINED FINDINGS FOR BOTH MR ABDOMEN AND PELVIS

Portions of the upper abdomen are excluded on this exam that was
performed to evaluate the uterus and the extent of uterine extension
into the abdomen.

HepatObiliary: normal imaged liver and gallbladder. No be
intrahepatic biliary duct dilatation.

Pancreas:  Normal imaged pancreas.

Spleen: Normal imaged spleen, without splenomegaly.

Adrenals/Urinary Tract: Normal adrenal glands. Normal kidneys,
without hydronephrosis. Normal urinary bladder.

Stomach/Bowel: Stomach excluded. Normal caliber of abdominal bowel
loops.

Vascular/Lymphatic: Normal lower abdominal aortic caliber. No pelvic
sidewall aneurysm. No lower abdominal or pelvic sidewall adenopathy.

Reproductive: Marked enlargement of the uterus, with innumerable
masses within. For example, the uterus measures 26.1 x 9.3 by
cm (volume = [M6] cm^3), including on 38/33 and 70/31. On the [M6]
ultrasound, the uterus measured 16.6 x 14.0 x 9.0 cm. (Volume = [M6]
cm^3). The endometrium is displaced to the left, but normal in
caliber where imaged.

The abdominal extension of uterine enlargement is seen to the
craniocaudal level of the SMA. A dominant lower uterine segment
nonenhancing mass measures 6.1 by 6.1 cm on 83/31.

The right ovary may be identified on [DATE]. The left ovary may be
identified anteriorly on [DATE]. Suboptimally evaluated, without
gross adnexal Mass.

Other: Trace free pelvic fluid is likely physiologic. No abdominal
ascites.

Fluid signal nonenhancing lesion posterior to the ascending colon
measures 2.3 x 1.8 cm on [DATE].

Musculoskeletal: No acute osseous abnormality.
IMPRESSION: 1. Massive enlargement of the uterus, with innumerable masses
within. These all likely represent fibroids. Uterine volume increase
of nearly 100% since the ultrasound of [DATE].
2. No hydronephrosis or other acute complication.
3. Nonenhancing T2 hyperintense lesion posterior to the ascending
colon, likely a lymphangioma or mesenteric cyst.

## 2021-05-25 MED ORDER — GADOBUTROL 1 MMOL/ML IV SOLN
9.5000 mL | Freq: Once | INTRAVENOUS | Status: AC | PRN
  Administered 2021-05-25: 9.5 mL via INTRAVENOUS

## 2021-05-26 ENCOUNTER — Telehealth: Payer: Self-pay | Admitting: *Deleted

## 2021-05-26 NOTE — Telephone Encounter (Signed)
error 

## 2021-05-28 LAB — PROTHROMBIN GENE MUTATION

## 2021-05-29 ENCOUNTER — Other Ambulatory Visit: Payer: Self-pay | Admitting: Gynecologic Oncology

## 2021-05-29 DIAGNOSIS — N8500 Endometrial hyperplasia, unspecified: Secondary | ICD-10-CM

## 2021-05-29 DIAGNOSIS — D259 Leiomyoma of uterus, unspecified: Secondary | ICD-10-CM

## 2021-06-02 ENCOUNTER — Other Ambulatory Visit: Payer: Self-pay

## 2021-06-02 ENCOUNTER — Telehealth: Payer: Self-pay | Admitting: *Deleted

## 2021-06-02 ENCOUNTER — Encounter: Payer: 59 | Admitting: Gynecologic Oncology

## 2021-06-02 ENCOUNTER — Ambulatory Visit (INDEPENDENT_AMBULATORY_CARE_PROVIDER_SITE_OTHER): Payer: 59

## 2021-06-02 VITALS — BP 115/75 | HR 79 | Temp 98.6°F | Resp 18 | Ht 64.0 in | Wt 194.2 lb

## 2021-06-02 DIAGNOSIS — N920 Excessive and frequent menstruation with regular cycle: Secondary | ICD-10-CM | POA: Diagnosis not present

## 2021-06-02 DIAGNOSIS — D500 Iron deficiency anemia secondary to blood loss (chronic): Secondary | ICD-10-CM

## 2021-06-02 MED ORDER — ACETAMINOPHEN 325 MG PO TABS
650.0000 mg | ORAL_TABLET | Freq: Once | ORAL | Status: AC
  Administered 2021-06-02: 650 mg via ORAL
  Filled 2021-06-02: qty 2

## 2021-06-02 MED ORDER — DIPHENHYDRAMINE HCL 50 MG/ML IJ SOLN: 50.0000 mg | Freq: Once | INTRAMUSCULAR | Status: DC | PRN

## 2021-06-02 MED ORDER — FAMOTIDINE IN NACL 20-0.9 MG/50ML-% IV SOLN: 20.0000 mg | Freq: Once | INTRAVENOUS | Status: DC | PRN

## 2021-06-02 MED ORDER — METHYLPREDNISOLONE SODIUM SUCC 125 MG IJ SOLR: 125.0000 mg | Freq: Once | INTRAMUSCULAR | Status: DC | PRN

## 2021-06-02 MED ORDER — ALBUTEROL SULFATE HFA 108 (90 BASE) MCG/ACT IN AERS: 2.0000 | INHALATION_SPRAY | Freq: Once | RESPIRATORY_TRACT | Status: DC | PRN

## 2021-06-02 MED ORDER — EPINEPHRINE 0.3 MG/0.3ML IJ SOAJ: 0.3000 mg | Freq: Once | INTRAMUSCULAR | Status: DC | PRN

## 2021-06-02 MED ORDER — SODIUM CHLORIDE 0.9 % IV SOLN: Freq: Once | INTRAVENOUS | Status: DC | PRN

## 2021-06-02 MED ORDER — DIPHENHYDRAMINE HCL 25 MG PO CAPS
50.0000 mg | ORAL_CAPSULE | Freq: Once | ORAL | Status: AC
  Administered 2021-06-02: 50 mg via ORAL
  Filled 2021-06-02: qty 2

## 2021-06-02 MED ORDER — SODIUM CHLORIDE 0.9 % IV SOLN
200.0000 mg | Freq: Once | INTRAVENOUS | Status: AC
  Administered 2021-06-02: 200 mg via INTRAVENOUS
  Filled 2021-06-02: qty 10

## 2021-06-02 NOTE — Progress Notes (Signed)
Diagnosis: Iron Deficiency Anemia  Provider:  Marshell Garfinkel, MD  Procedure: Infusion  IV Type: Peripheral, IV Location: R Antecubital  Venofer (Iron Sucrose), Dose: 200 mg  Infusion Start Time: 5597  Infusion Stop Time: 1625  Post Infusion IV Care: Observation period completed and Peripheral IV Discontinued  Discharge: Condition: Good, Destination: Home . AVS provided to patient.   Performed by:  Mignon Bechler, Sherlon Handing, LPN

## 2021-06-02 NOTE — Telephone Encounter (Signed)
Attempted to reach patient to communicate Dr Libby Maw review of labs.    Left message pending call back.

## 2021-06-02 NOTE — Telephone Encounter (Signed)
-----   Message from Orson Slick, MD sent at 06/02/2021  2:03 PM EST ----- Please let Mrs. Pittmon know that her blood work was consistent with iron deficiency anemia, however we did not find any signs of a clotting disorder or genetic clotting disorder in her blood work.  I recommend we proceed with IV iron therapy and we will see her back after she has completed this to assure it has improved her hemoglobin. ----- Message ----- From: Interface, [facility] [place] Sent: 05/21/2021   4:33 PM EST To: Orson Slick, MD

## 2021-06-03 ENCOUNTER — Other Ambulatory Visit: Payer: Self-pay | Admitting: Gynecologic Oncology

## 2021-06-03 ENCOUNTER — Telehealth: Payer: Self-pay | Admitting: *Deleted

## 2021-06-03 DIAGNOSIS — R140 Abdominal distension (gaseous): Secondary | ICD-10-CM

## 2021-06-03 DIAGNOSIS — R1900 Intra-abdominal and pelvic swelling, mass and lump, unspecified site: Secondary | ICD-10-CM

## 2021-06-03 DIAGNOSIS — D259 Leiomyoma of uterus, unspecified: Secondary | ICD-10-CM

## 2021-06-03 NOTE — Telephone Encounter (Signed)
Received call back from pt. Reviewed labs with her and reviewed her schedule. She was scheduled to see Dr. Lorenso Courier on 06/26/21 but will not complete her IV iron u]ntil 06/30/21. Advised we will re-schedule the visit to 07/07/21 (2 days before her surgery) to review her  pre-op labs being done on 07/03/21. Pt is in agreement.  Scheduling message sent

## 2021-06-09 ENCOUNTER — Other Ambulatory Visit: Payer: Self-pay

## 2021-06-09 ENCOUNTER — Other Ambulatory Visit: Payer: Self-pay | Admitting: Gynecologic Oncology

## 2021-06-09 ENCOUNTER — Ambulatory Visit (INDEPENDENT_AMBULATORY_CARE_PROVIDER_SITE_OTHER): Payer: 59

## 2021-06-09 ENCOUNTER — Encounter: Payer: Self-pay | Admitting: Gynecologic Oncology

## 2021-06-09 VITALS — BP 131/88 | HR 107 | Temp 98.5°F | Resp 20 | Wt 195.4 lb

## 2021-06-09 DIAGNOSIS — D259 Leiomyoma of uterus, unspecified: Secondary | ICD-10-CM

## 2021-06-09 DIAGNOSIS — D500 Iron deficiency anemia secondary to blood loss (chronic): Secondary | ICD-10-CM

## 2021-06-09 DIAGNOSIS — R1900 Intra-abdominal and pelvic swelling, mass and lump, unspecified site: Secondary | ICD-10-CM

## 2021-06-09 DIAGNOSIS — N920 Excessive and frequent menstruation with regular cycle: Secondary | ICD-10-CM | POA: Diagnosis not present

## 2021-06-09 MED ORDER — SODIUM CHLORIDE 0.9 % IV SOLN: Freq: Once | INTRAVENOUS | Status: DC | PRN

## 2021-06-09 MED ORDER — SODIUM CHLORIDE 0.9 % IV SOLN
200.0000 mg | Freq: Once | INTRAVENOUS | Status: AC
  Administered 2021-06-09: 200 mg via INTRAVENOUS
  Filled 2021-06-09: qty 10

## 2021-06-09 MED ORDER — DIPHENHYDRAMINE HCL 50 MG/ML IJ SOLN: 50.0000 mg | Freq: Once | INTRAMUSCULAR | Status: DC | PRN

## 2021-06-09 MED ORDER — ALBUTEROL SULFATE HFA 108 (90 BASE) MCG/ACT IN AERS: 2.0000 | INHALATION_SPRAY | Freq: Once | RESPIRATORY_TRACT | Status: DC | PRN

## 2021-06-09 MED ORDER — ACETAMINOPHEN 325 MG PO TABS: 650.0000 mg | ORAL_TABLET | Freq: Once | ORAL | Status: DC

## 2021-06-09 MED ORDER — FAMOTIDINE IN NACL 20-0.9 MG/50ML-% IV SOLN: 20.0000 mg | Freq: Once | INTRAVENOUS | Status: DC | PRN

## 2021-06-09 MED ORDER — EPINEPHRINE 0.3 MG/0.3ML IJ SOAJ: 0.3000 mg | Freq: Once | INTRAMUSCULAR | Status: DC | PRN

## 2021-06-09 MED ORDER — DIPHENHYDRAMINE HCL 25 MG PO CAPS: 50.0000 mg | ORAL_CAPSULE | Freq: Once | ORAL | Status: DC

## 2021-06-09 MED ORDER — METHYLPREDNISOLONE SODIUM SUCC 125 MG IJ SOLR: 125.0000 mg | Freq: Once | INTRAMUSCULAR | Status: DC | PRN

## 2021-06-09 NOTE — Progress Notes (Signed)
Per Dr. Berline Lopes, plan to proceed with uterine artery embolization prior to surgery on July 09, 2021 for large uterine fibroids.

## 2021-06-09 NOTE — Progress Notes (Signed)
Diagnosis: Iron Deficiency Anemia  Provider:  Marshell Garfinkel, MD  Procedure: Infusion  IV Type: Peripheral, IV Location: R Antecubital  Venofer (Iron Sucrose), Dose: 200 mg  Infusion Start Time: 15.38  Infusion Stop Time: 15.53  Post Infusion IV Care: Peripheral IV Discontinued  Discharge: Condition: Good, Destination: Home . AVS provided to patient.   Performed by:  Arnoldo Morale, RN

## 2021-06-11 ENCOUNTER — Encounter: Payer: Self-pay | Admitting: *Deleted

## 2021-06-11 ENCOUNTER — Other Ambulatory Visit: Payer: Self-pay

## 2021-06-11 ENCOUNTER — Ambulatory Visit
Admission: RE | Admit: 2021-06-11 | Discharge: 2021-06-11 | Disposition: A | Discharge status: Home / Self Care | Payer: 59 | Source: Ambulatory Visit | Attending: Gynecologic Oncology | Admitting: Gynecologic Oncology

## 2021-06-11 DIAGNOSIS — D259 Leiomyoma of uterus, unspecified: Secondary | ICD-10-CM

## 2021-06-11 DIAGNOSIS — R1900 Intra-abdominal and pelvic swelling, mass and lump, unspecified site: Secondary | ICD-10-CM

## 2021-06-11 HISTORY — PX: IR RADIOLOGIST EVAL & MGMT: IMG5224

## 2021-06-11 NOTE — Consult Note (Signed)
Chief Complaint: Symptomatic fibroids  Referring Physician(s): Cross,Melissa D Jeral Pinch (Ob-Gyn)  History of Present Illness: Betty Gutierrez is a 50 y.o. (G, [place]) female with past medical history significant for previous myomectomy in 2000 as well as uterine fibroid embolization at [facility] in 2014 who is referred today for preoperative uterine fibroid embolization prior to definitive hysterectomy secondary to symptomatic fibroids as well as endometrial hyperplasia without atypia.  Patient has a family history of pulmonary embolism however personally has not experienced a DVT or pulmonary embolism.  Patient's fibroid related complaints include both menorrhagia as well as bulk symptoms.   Past Medical History:  Diagnosis Date   Family history of pulmonary embolism 07/31/2017    Past Surgical History:  Procedure Laterality Date   Cesarian  N/A 2004   MYOMECTOMY N/A 2000   uterine fibroid emoblization  N/A 2014    Allergies: Hydromorphone  Medications: Prior to Admission medications   Medication Sig Start Date End Date Taking? Authorizing Provider  iron polysaccharides (NIFEREX) 150 MG capsule Take 150 mg by mouth daily. 02/05/21   [provider]  Rivaroxaban 15 & 20 MG TBPK Take as directed on package: Start with one 15mg  tablet by mouth twice a day with food. On Day 22, switch to one 20mg  tablet once a day with food. Patient not taking: Reported on 05/13/2021 07/07/17   Curt Bears, MD     Family History  Problem Relation Age of Onset   Pulmonary embolism Mother        following international flight   Breast cancer Mother    Prostate cancer Father    Pulmonary embolism Sister    Pulmonary embolism Sister    Pulmonary embolism Maternal Aunt        Twin of the patient's mother, also occurred following and international flight   Colon cancer Neg Hx    Ovarian cancer Neg Hx    Endometrial cancer Neg Hx    Pancreatic cancer Neg Hx      Social History   Socioeconomic History   Marital status: Divorced    Spouse name: Not on file   Number of children: Not on file   Years of education: Not on file   Highest education level: Not on file  Occupational History   Not on file  Tobacco Use   Smoking status: Never   Smokeless tobacco: Never  Substance and Sexual Activity   Alcohol use: Yes    Alcohol/week: 1.0 standard drink    Types: 1 Glasses of wine per week   Drug use: No   Sexual activity: Not Currently  Other Topics Concern   Not on file  Social History Narrative   Not on file   Social Determinants of Health   Financial Resource Strain: Not on file  Food Insecurity: Not on file  Transportation Needs: Not on file  Physical Activity: Not on file  Stress: Not on file  Social Connections: Not on file    ECOG Status: 1 - Symptomatic but completely ambulatory  Review of Systems  Review of Systems: A 12 point ROS discussed and pertinent positives are indicated in the HPI above.  All other systems are negative.  Physical Exam No direct physical exam was performed (except for noted visual exam findings with Video Visits).   Vital Signs: There were no vitals taken for this visit.  Imaging: MR Pelvis W Wo Contrast  Result Date: 06/04/2021 CLINICAL DATA:  Abdominal mass. Intra-abdominal neoplasm suspected. Evaluate uterine  fibroid. Abdominal distension. EXAM: MRI ABDOMEN AND PELVIS WITHOUT AND WITH CONTRAST TECHNIQUE: Multiplanar multisequence MR imaging of the abdomen and pelvis was performed both before and after the administration of intravenous contrast. CONTRAST:  9.72mL GADAVIST GADOBUTROL 1 MMOL/ML IV SOLN COMPARISON:  None.  An outside MRI of 03/14/2013 is not available. FINDINGS: COMBINED FINDINGS FOR BOTH MR ABDOMEN AND PELVIS Portions of the upper abdomen are excluded on this exam that was performed to evaluate the uterus and the extent of uterine extension into the abdomen. HepatObiliary: normal  imaged liver and gallbladder. No be intrahepatic biliary duct dilatation. Pancreas:  Normal imaged pancreas. Spleen: Normal imaged spleen, without splenomegaly. Adrenals/Urinary Tract: Normal adrenal glands. Normal kidneys, without hydronephrosis. Normal urinary bladder. Stomach/Bowel: Stomach excluded. Normal caliber of abdominal bowel loops. Vascular/Lymphatic: Normal lower abdominal aortic caliber. No pelvic sidewall aneurysm. No lower abdominal or pelvic sidewall adenopathy. Reproductive: Marked enlargement of the uterus, with innumerable masses within. For example, the uterus measures 26.1 x 9.3 by 16.0 cm (volume = 2000 cm^3), including on 38/33 and 70/31. On the 2014 ultrasound, the uterus measured 16.6 x 14.0 x 9.0 cm. (Volume = 1100 cm^3). The endometrium is displaced to the left, but normal in caliber where imaged. The abdominal extension of uterine enlargement is seen to the craniocaudal level of the SMA. A dominant lower uterine segment nonenhancing mass measures 6.1 by 6.1 cm on 83/31. The right ovary may be identified on 08/12. The left ovary may be identified anteriorly on 05/12. Suboptimally evaluated, without gross adnexal Mass. Other: Trace free pelvic fluid is likely physiologic. No abdominal ascites. Fluid signal nonenhancing lesion posterior to the ascending colon measures 2.3 x 1.8 cm on 19/11. Musculoskeletal: No acute osseous abnormality. IMPRESSION: 1. Massive enlargement of the uterus, with innumerable masses within. These all likely represent fibroids. Uterine volume increase of nearly 100% since the ultrasound of 01/12/2013. 2. No hydronephrosis or other acute complication. 3. 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 Electronically Signed   By: Abigail Miyamoto M.D.   On: 06/04/2021 13:55   MR ABDOMEN WWO CONTRAST  Result Date: 06/04/2021 CLINICAL DATA:  Abdominal mass. Intra-abdominal neoplasm suspected. Evaluate  uterine fibroid. Abdominal distension. EXAM: MRI ABDOMEN AND PELVIS WITHOUT AND WITH CONTRAST TECHNIQUE: Multiplanar multisequence MR imaging of the abdomen and pelvis was performed both before and after the administration of intravenous contrast. CONTRAST:  9.21mL GADAVIST GADOBUTROL 1 MMOL/ML IV SOLN COMPARISON:  None.  An outside MRI of 03/14/2013 is not available. FINDINGS: COMBINED FINDINGS FOR BOTH MR ABDOMEN AND PELVIS Portions of the upper abdomen are excluded on this exam that was performed to evaluate the uterus and the extent of uterine extension into the abdomen. HepatObiliary: normal imaged liver and gallbladder. No be intrahepatic biliary duct dilatation. Pancreas:  Normal imaged pancreas. Spleen: Normal imaged spleen, without splenomegaly. Adrenals/Urinary Tract: Normal adrenal glands. Normal kidneys, without hydronephrosis. Normal urinary bladder. Stomach/Bowel: Stomach excluded. Normal caliber of abdominal bowel loops. Vascular/Lymphatic: Normal lower abdominal aortic caliber. No pelvic sidewall aneurysm. No lower abdominal or pelvic sidewall adenopathy. Reproductive: Marked enlargement of the uterus, with innumerable masses within. For example, the uterus measures 26.1 x 9.3 by 16.0 cm (volume = 2000 cm^3), including on 38/33 and 70/31. On the 2014 ultrasound, the uterus measured 16.6 x 14.0 x 9.0 cm. (Volume = 1100 cm^3). The endometrium is displaced to the left, but normal in caliber where imaged. The abdominal extension of uterine enlargement is seen to the  craniocaudal level of the SMA. A dominant lower uterine segment nonenhancing mass measures 6.1 by 6.1 cm on 83/31. The right ovary may be identified on 08/12. The left ovary may be identified anteriorly on 05/12. Suboptimally evaluated, without gross adnexal Mass. Other: Trace free pelvic fluid is likely physiologic. No abdominal ascites. Fluid signal nonenhancing lesion posterior to the ascending colon measures 2.3 x 1.8 cm on 19/11.  Musculoskeletal: No acute osseous abnormality. IMPRESSION: 1. Massive enlargement of the uterus, with innumerable masses within. These all likely represent fibroids. Uterine volume increase of nearly 100% since the ultrasound of 01/12/2013. 2. No hydronephrosis or other acute complication. 3. 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 Electronically Signed   By: Abigail Miyamoto M.D.   On: 06/04/2021 13:55    Labs:  CBC: Recent Labs    05/13/21 1615 05/21/21 1616  WBC 4.6 4.9  HGB 8.2* 8.3*  HCT 28.9* 29.0*  PLT 319 281    COAGS: No results for input(s): INR, APTT in the last 8760 hours.  BMP: Recent Labs    05/21/21 1616  NA 139  K 3.7  CL 106  CO2 23  GLUCOSE 81  BUN 9  CALCIUM 9.0  CREATININE 0.77  GFRNONAA >60    LIVER FUNCTION TESTS: Recent Labs    05/21/21 1616  BILITOT 0.3  AST 18  ALT 14  ALKPHOS 40  PROT 7.4  ALBUMIN 3.8    TUMOR MARKERS: No results for input(s): AFPTM, CEA, CA199, CHROMGRNA in the last 8760 hours.  Assessment and Plan:  Betty Gutierrez is a 50 y.o. (G, [place]) female with past medical history significant for previous myomectomy in 2000 as well as uterine fibroid embolization at [facility] in 2014 who is referred today for preoperative uterine fibroid embolization prior to definitive hysterectomy secondary to symptomatic fibroids as well as endometrial hyperplasia without atypia.  Personal review of abdominal MRI performed 06/04/2021 demonstrates marked enlargement of the uterus containing innumerable fibroids, the majority of which demonstrated expected enhancement compatible with viability.  Given above, I feel the patient is an excellent candidate for preoperative uterine fibroid embolization.  The patient has previously undergone a uterine fibroid embolization in 2014 however again, the benefits and risks of preoperative uterine fibroid embolization was discussed at  great length with the patient including bleeding, vessel injury, contrast and radiation exposure as well as nontarget embolization.  I explained that the procedure would be performed on December 14th, the day prior to her definitive hysterectomy (scheduled for December 15th) at [facility] and that the patient would be admitted to the hospital for postprocedural of the observation as well as PCA usage.  (Note, the patient reports that she had a Dilaudid PCA during her embolization in 2014 however the Dilaudid was upsetting to her stomach though she tolerated the Percocets well without incident.)  Following this prolonged and detailed conversation, the patient agrees to undergo the pre-operative uterine fibroid embolization.    Given the necessary timing of the procedure I explained that we will make all efforts to ensure I am the one to perform the procedure however there is a chance one of my other capable interventional radiologists may perform the procedure to accommodate Dr. Charisse March operative schedule.  Plan:  - Schedule the uterine fibroid embolization on December 14th at [facility]. - Patient will be admitted for overnight observation and PCA usage prior to undergoing definitive hysterectomy on December 15th.  Thank you for this interesting consult.  I greatly enjoyed meeting Betty Gutierrez and look forward to participating in their care.  A copy of this report was sent to the requesting provider on this date.  Electronically Signed: Sandi Mariscal 06/11/2021, 10:20 AM   I spent a total of 30 Minutes in remote  clinical consultation, greater than 50% of which was counseling/coordinating care for preoperative uterine fibroid embolization.    Visit type: Audio only (telephone). Audio (no video) only due to patient's lack of internet/smartphone capability. Alternative for in-person consultation at [facility], [address], [place], [place]. This visit type  was conducted due to national recommendations for restrictions regarding the COVID-19 Pandemic (e.g. social distancing).  This format is felt to be most appropriate for this patient at this time.  All issues noted in this document were discussed and addressed.

## 2021-06-12 ENCOUNTER — Other Ambulatory Visit: Payer: Self-pay | Admitting: Physician Assistant

## 2021-06-16 ENCOUNTER — Telehealth: Payer: 59

## 2021-06-16 ENCOUNTER — Ambulatory Visit (INDEPENDENT_AMBULATORY_CARE_PROVIDER_SITE_OTHER): Payer: 59

## 2021-06-16 ENCOUNTER — Other Ambulatory Visit: Payer: Self-pay

## 2021-06-16 VITALS — BP 128/83 | HR 72 | Temp 98.6°F | Resp 16 | Ht 64.0 in | Wt 192.6 lb

## 2021-06-16 DIAGNOSIS — D500 Iron deficiency anemia secondary to blood loss (chronic): Secondary | ICD-10-CM | POA: Diagnosis not present

## 2021-06-16 MED ORDER — SODIUM CHLORIDE 0.9 % IV SOLN
200.0000 mg | Freq: Once | INTRAVENOUS | Status: AC
  Administered 2021-06-16: 200 mg via INTRAVENOUS
  Filled 2021-06-16: qty 10

## 2021-06-16 MED ORDER — DIPHENHYDRAMINE HCL 25 MG PO CAPS: 50.0000 mg | ORAL_CAPSULE | Freq: Once | ORAL | Status: DC

## 2021-06-16 MED ORDER — ACETAMINOPHEN 325 MG PO TABS: 650.0000 mg | ORAL_TABLET | Freq: Once | ORAL | Status: DC

## 2021-06-16 NOTE — Progress Notes (Signed)
Diagnosis: Iron Deficiency Anemia  Provider:  Marshell Garfinkel, MD  Procedure: Infusion  IV Type: Peripheral, IV Location: R Antecubital  Venofer (Iron Sucrose), Dose: 200 mg  Infusion Start Time: 9169  Infusion Stop Time: 4503 8882 Post Infusion IV Care: Peripheral IV Discontinued  Discharge: Condition: Good, Destination: Home . AVS provided to patient.   Performed by:  Koren Shiver, RN

## 2021-06-23 ENCOUNTER — Other Ambulatory Visit: Payer: Self-pay

## 2021-06-23 ENCOUNTER — Ambulatory Visit (INDEPENDENT_AMBULATORY_CARE_PROVIDER_SITE_OTHER): Payer: 59

## 2021-06-23 VITALS — BP 129/84 | HR 71 | Temp 97.9°F | Resp 18 | Ht 64.0 in | Wt 194.8 lb

## 2021-06-23 DIAGNOSIS — D500 Iron deficiency anemia secondary to blood loss (chronic): Secondary | ICD-10-CM | POA: Diagnosis not present

## 2021-06-23 MED ORDER — FAMOTIDINE IN NACL 20-0.9 MG/50ML-% IV SOLN: 20.0000 mg | Freq: Once | INTRAVENOUS | Status: DC | PRN

## 2021-06-23 MED ORDER — SODIUM CHLORIDE 0.9 % IV SOLN
200.0000 mg | Freq: Once | INTRAVENOUS | Status: AC
  Administered 2021-06-23: 200 mg via INTRAVENOUS
  Filled 2021-06-23: qty 10

## 2021-06-23 MED ORDER — DIPHENHYDRAMINE HCL 50 MG/ML IJ SOLN: 50.0000 mg | Freq: Once | INTRAMUSCULAR | Status: DC | PRN

## 2021-06-23 MED ORDER — SODIUM CHLORIDE 0.9 % IV SOLN: Freq: Once | INTRAVENOUS | Status: DC | PRN

## 2021-06-23 MED ORDER — METHYLPREDNISOLONE SODIUM SUCC 125 MG IJ SOLR: 125.0000 mg | Freq: Once | INTRAMUSCULAR | Status: DC | PRN

## 2021-06-23 MED ORDER — ALBUTEROL SULFATE HFA 108 (90 BASE) MCG/ACT IN AERS: 2.0000 | INHALATION_SPRAY | Freq: Once | RESPIRATORY_TRACT | Status: DC | PRN

## 2021-06-23 MED ORDER — DIPHENHYDRAMINE HCL 25 MG PO CAPS: 50.0000 mg | ORAL_CAPSULE | Freq: Once | ORAL | Status: DC

## 2021-06-23 MED ORDER — ACETAMINOPHEN 325 MG PO TABS: 650.0000 mg | ORAL_TABLET | Freq: Once | ORAL | Status: DC

## 2021-06-23 MED ORDER — EPINEPHRINE 0.3 MG/0.3ML IJ SOAJ: 0.3000 mg | Freq: Once | INTRAMUSCULAR | Status: DC | PRN

## 2021-06-23 NOTE — Progress Notes (Signed)
Diagnosis: Iron Deficiency Anemia  Provider:  Marshell Garfinkel, MD  Procedure: Infusion  IV Type: Peripheral, IV Location: R Antecubital  Venofer (Iron Sucrose), Dose: 200 mg  Infusion Start Time: 15.37 06/23/2021  Infusion Stop Time: 16.00 06/23/2021  Post Infusion IV Care: Observation period completed and Peripheral IV Discontinued  Discharge: Condition: Good, Destination: Home . AVS provided to patient.   Performed by:  Arnoldo Morale, RN

## 2021-06-24 ENCOUNTER — Telehealth: Payer: Self-pay

## 2021-06-24 NOTE — Telephone Encounter (Signed)
Spoke with Ms. Bogue this afternoon. Patient states she did not see Dr. Tuckers message on 06/09/21 but knew this was the plan and has met with interventional radiology.  Patient states she will be sending over short term disability paperwork for our office to complete. Provided her with fax number [phone]. Instructed patient to call with any questions or concerns.  

## 2021-06-24 NOTE — Telephone Encounter (Signed)
Attempted to reach [facility] regarding Dr. Charisse March MyChart message on 06/09/21. Unable to reach patient and unable to leave voicemail. Will attempt to reach patient again.

## 2021-06-26 ENCOUNTER — Ambulatory Visit: Payer: 59 | Admitting: Hematology and Oncology

## 2021-06-26 ENCOUNTER — Other Ambulatory Visit: Payer: 59

## 2021-06-30 ENCOUNTER — Ambulatory Visit (INDEPENDENT_AMBULATORY_CARE_PROVIDER_SITE_OTHER): Payer: 59

## 2021-06-30 ENCOUNTER — Other Ambulatory Visit: Payer: Self-pay

## 2021-06-30 VITALS — BP 118/78 | HR 82 | Temp 98.1°F | Resp 16 | Ht 64.0 in | Wt 198.4 lb

## 2021-06-30 DIAGNOSIS — D500 Iron deficiency anemia secondary to blood loss (chronic): Secondary | ICD-10-CM

## 2021-06-30 DIAGNOSIS — D649 Anemia, unspecified: Secondary | ICD-10-CM

## 2021-06-30 HISTORY — DX: Anemia, unspecified: D64.9

## 2021-06-30 MED ORDER — ACETAMINOPHEN 325 MG PO TABS: 650.0000 mg | ORAL_TABLET | Freq: Once | ORAL | Status: DC

## 2021-06-30 MED ORDER — SODIUM CHLORIDE 0.9 % IV SOLN
200.0000 mg | Freq: Once | INTRAVENOUS | Status: AC
  Administered 2021-06-30: 200 mg via INTRAVENOUS
  Filled 2021-06-30: qty 10

## 2021-06-30 MED ORDER — EPINEPHRINE 0.3 MG/0.3ML IJ SOAJ: 0.3000 mg | Freq: Once | INTRAMUSCULAR | Status: DC | PRN

## 2021-06-30 MED ORDER — SODIUM CHLORIDE 0.9 % IV SOLN: Freq: Once | INTRAVENOUS | Status: DC | PRN

## 2021-06-30 MED ORDER — METHYLPREDNISOLONE SODIUM SUCC 125 MG IJ SOLR: 125.0000 mg | Freq: Once | INTRAMUSCULAR | Status: DC | PRN

## 2021-06-30 MED ORDER — DIPHENHYDRAMINE HCL 50 MG/ML IJ SOLN: 50.0000 mg | Freq: Once | INTRAMUSCULAR | Status: DC | PRN

## 2021-06-30 MED ORDER — FAMOTIDINE IN NACL 20-0.9 MG/50ML-% IV SOLN: 20.0000 mg | Freq: Once | INTRAVENOUS | Status: DC | PRN

## 2021-06-30 MED ORDER — ALBUTEROL SULFATE HFA 108 (90 BASE) MCG/ACT IN AERS: 2.0000 | INHALATION_SPRAY | Freq: Once | RESPIRATORY_TRACT | Status: DC | PRN

## 2021-06-30 MED ORDER — DIPHENHYDRAMINE HCL 25 MG PO CAPS: 50.0000 mg | ORAL_CAPSULE | Freq: Once | ORAL | Status: DC

## 2021-06-30 NOTE — Progress Notes (Signed)
Diagnosis: Iron Deficiency Anemia  Provider:  Marshell Garfinkel, MD  Procedure: Infusion  IV Type: Peripheral, IV Location: R Antecubital  Venofer (Iron Sucrose), Dose: 200 mg  Infusion Start Time: 2831  Infusion Stop Time: 5176  Post Infusion IV Care: Peripheral IV Discontinued  Discharge: Condition: Good, Destination: Home . AVS provided to patient.   Performed by:  Koren Shiver, RN

## 2021-07-02 ENCOUNTER — Other Ambulatory Visit: Payer: Self-pay | Admitting: Physician Assistant

## 2021-07-02 DIAGNOSIS — D500 Iron deficiency anemia secondary to blood loss (chronic): Secondary | ICD-10-CM

## 2021-07-02 NOTE — Patient Instructions (Addendum)
DUE TO COVID-19 ONLY ONE VISITOR IS ALLOWED TO COME WITH YOU AND STAY IN THE WAITING ROOM ONLY DURING PRE OP AND PROCEDURE DAY OF SURGERY IF YOU ARE GOING HOME AFTER SURGERY. IF YOU ARE SPENDING THE NIGHT 2 PEOPLE MAY VISIT WITH YOU IN YOUR PRIVATE ROOM AFTER SURGERY UNTIL VISITING  HOURS ARE OVER AT 8:00 PM AND 1  VISITOR  MAY  SPEND THE NIGHT.  Get a covid test on 07/06/21 at [facility] between the hours of 8:00 am to 3:00 pm Fill out yellow highlighted areas of paperwork and give to the testers.                Betty Gutierrez               Your IR procedure is on 07/08/21. Arrive to admitting at 7:00 am   (Your surgical procedure is scheduled on: 07/09/21)    Follow Dr.'s office instructions for diet.  Have a light diet the day before surgery    DRINK 2 PRESURGERY ENSURE DRINKS THE NIGHT BEFORE SURGERY AT 10:00 PM .   NO SOLIDS AFTER MIDNIGHT THE DAY PRIOR TO THE SURGERY.   NOTHING BY MOUTH EXCEPT CLEAR LIQUIDS UNTIL 10:30 am  THREE HOURS PRIOR TO SCHEDULED SURGERY. PLEASE FINISH PRESURGERY ENSURE DRINK PER SURGEON ORDER 3 HOURS PRIOR TO SCHEDULED SURGERY TIME WHICH NEEDS TO BE COMPLETED AT _10:30________.   CLEAR LIQUID DIET   Foods Allowed                                                                     Foods Excluded  Coffee and tea, regular and decaf                             liquids that you cannot  Plain Jell-O any favor except red or purple                                           see through such as: Fruit ices (not with fruit pulp)                                     milk, soups, orange juice  Iced Popsicles                                    All solid food Carbonated beverages, regular and diet                                    Cranberry, grape and apple juices Sports drinks like Gatorade Lightly seasoned clear broth or consume(fat free) Sugar  BRUSH YOUR TEETH MORNING OF SURGERY AND RINSE YOUR MOUTH OUT, NO CHEWING GUM CANDY OR  MINTS.     Take these medicines the morning of surgery with A SIP OF WATER: none  You may not have any metal on your body including hair pins and              piercings  Do not wear jewelry, make-up, lotions, powders or perfumes, deodorant             Do not wear nail polish on your fingernails.  Do not shave  48 hours prior to surgery.               Do not bring valuables to the hospital. [place].  Contacts, dentures or bridgework may not be worn into surgery.     Patients discharged the day of surgery will not be allowed to drive home.  IF YOU ARE HAVING SURGERY AND GOING HOME THE SAME DAY, YOU MUST HAVE AN ADULT TO DRIVE YOU HOME AND BE WITH YOU FOR 24 HOURS. YOU MAY GO HOME BY TAXI OR UBER OR ORTHERWISE, BUT AN ADULT MUST ACCOMPANY YOU HOME AND STAY WITH YOU FOR 24 HOURS.  Name and phone number of your driver:  Special Instructions: N/A              Please read over the following fact sheets you were given: _____________________________________________________________________             [facility] - Preparing for Surgery Before surgery, you can play an important role.  Because skin is not sterile, your skin needs to be as free of germs as possible.  You can reduce the number of germs on your skin by washing with CHG (chlorahexidine gluconate) soap before surgery.  CHG is an antiseptic cleaner which kills germs and bonds with the skin to continue killing germs even after washing. Please DO NOT use if you have an allergy to CHG or antibacterial soaps.  If your skin becomes reddened/irritated stop using the CHG and inform your nurse when you arrive at Short Stay. Do not shave (including legs and underarms) for at least 48 hours prior to the first CHG shower Please follow these instructions carefully:  1.  Shower with CHG Soap the night before surgery and the  morning of Surgery.  2.  If you choose to  wash your hair, wash your hair first as usual with your  normal  shampoo.  3.  After you shampoo, rinse your hair and body thoroughly to remove the  shampoo.                            4.  Use CHG as you would any other liquid soap.  You can apply chg directly  to the skin and wash                       Gently with a scrungie or clean washcloth.  5.  Apply the CHG Soap to your body ONLY FROM THE NECK DOWN.   Do not use on face/ open                           Wound or open sores. Avoid contact with eyes, ears mouth and genitals (private parts).                       Wash face,  Genitals (private parts) with your normal soap.  6.  Wash thoroughly, paying special attention to the area where your surgery  will be performed.  7.  Thoroughly rinse your body with warm water from the neck down.  8.  DO NOT shower/wash with your normal soap after using and rinsing off  the CHG Soap.                9.  Pat yourself dry with a clean towel.            10.  Wear clean pajamas.            11.  Place clean sheets on your bed the night of your first shower and do not  sleep with pets. Day of Surgery : Do not apply any lotions/deodorants the morning of surgery.  Please wear clean clothes to the hospital/surgery center.  FAILURE TO FOLLOW THESE INSTRUCTIONS MAY RESULT IN THE CANCELLATION OF YOUR SURGERY PATIENT SIGNATURE_________________________________  NURSE SIGNATURE__________________________________  ________________________________________________________________________

## 2021-07-03 ENCOUNTER — Encounter (HOSPITAL_COMMUNITY): Payer: Self-pay

## 2021-07-03 ENCOUNTER — Other Ambulatory Visit: Payer: Self-pay

## 2021-07-03 ENCOUNTER — Telehealth: Payer: Self-pay | Admitting: *Deleted

## 2021-07-03 ENCOUNTER — Encounter (HOSPITAL_COMMUNITY)
Admission: RE | Admit: 2021-07-03 | Discharge: 2021-07-03 | Disposition: A | Discharge status: Home / Self Care | Payer: 59 | Source: Ambulatory Visit | Attending: Gynecologic Oncology | Admitting: Gynecologic Oncology

## 2021-07-03 ENCOUNTER — Other Ambulatory Visit: Payer: Self-pay | Admitting: Gynecologic Oncology

## 2021-07-03 DIAGNOSIS — D259 Leiomyoma of uterus, unspecified: Secondary | ICD-10-CM

## 2021-07-03 DIAGNOSIS — Z01812 Encounter for preprocedural laboratory examination: Secondary | ICD-10-CM | POA: Diagnosis not present

## 2021-07-03 DIAGNOSIS — N8500 Endometrial hyperplasia, unspecified: Secondary | ICD-10-CM

## 2021-07-03 DIAGNOSIS — D500 Iron deficiency anemia secondary to blood loss (chronic): Secondary | ICD-10-CM

## 2021-07-03 DIAGNOSIS — D72819 Decreased white blood cell count, unspecified: Secondary | ICD-10-CM

## 2021-07-03 HISTORY — DX: Nontoxic multinodular goiter: E04.2

## 2021-07-03 LAB — COMPREHENSIVE METABOLIC PANEL
ALT: 17 U/L (ref 0–44)
AST: 19 U/L (ref 15–41)
Albumin: 4.1 g/dL (ref 3.5–5.0)
Alkaline Phosphatase: 33 U/L — ABNORMAL LOW (ref 38–126)
Anion gap: 8 (ref 5–15)
BUN: 11 mg/dL (ref 6–20)
CO2: 22 mmol/L (ref 22–32)
Calcium: 8.6 mg/dL — ABNORMAL LOW (ref 8.9–10.3)
Chloride: 106 mmol/L (ref 98–111)
Creatinine, Ser: 0.69 mg/dL (ref 0.44–1.00)
GFR, Estimated: >60 mL/min (ref >60)
Glucose, Bld: 85 mg/dL (ref 70–99)
Potassium: 3.8 mmol/L (ref 3.5–5.1)
Sodium: 136 mmol/L (ref 135–145)
Total Bilirubin: 0.3 mg/dL (ref 0.3–1.2)
Total Protein: 7.4 g/dL (ref 6.5–8.1)

## 2021-07-03 LAB — CBC
HCT: 38.3 % (ref 36.0–46.0)
Hemoglobin: 11.2 g/dL — ABNORMAL LOW (ref 12.0–15.0)
MCH: 23.2 pg — ABNORMAL LOW (ref 26.0–34.0)
MCHC: 29.2 g/dL — ABNORMAL LOW (ref 30.0–36.0)
MCV: 79.5 fL — ABNORMAL LOW (ref 80.0–100.0)
Platelets: 241 10*3/uL (ref 150–400)
RBC: 4.82 MIL/uL (ref 3.87–5.11)
RDW: 29.8 % — ABNORMAL HIGH (ref 11.5–15.5)
WBC: 3.4 10*3/uL — ABNORMAL LOW (ref 4.0–10.5)
nRBC: 0 % (ref 0.0–0.2)

## 2021-07-03 LAB — URINALYSIS, ROUTINE W REFLEX MICROSCOPIC
Bilirubin Urine: NEGATIVE
Glucose, UA: NEGATIVE mg/dL
Hgb urine dipstick: NEGATIVE
Ketones, ur: NEGATIVE mg/dL
Leukocytes,Ua: NEGATIVE
Nitrite: NEGATIVE
Protein, ur: NEGATIVE mg/dL
Specific Gravity, Urine: >1.03 — ABNORMAL HIGH (ref 1.005–1.030)
pH: 5.5 (ref 5.0–8.0)

## 2021-07-03 NOTE — Telephone Encounter (Signed)
Attempted to reach the patient to schedule a pre op appt with Melissa APP and labs; no answer and no voicemail

## 2021-07-03 NOTE — Telephone Encounter (Signed)
Spoke with Ms. Cumbo this morning. Reviewed CBC results from this morning. Per Joylene John, NP  She had a good response to the iron, hgb is 11.2. White blood cell count is slightly low. We will plan to repeat labs on Tuesday. Lab appointment scheduled for 07/07/21 at 2:15pm before her appointment with Dede Query, PA at 2:40pm  Patient verbalized understanding.  Pre-op appointment scheduled with Joylene John, NP for Monday 07/06/21 at 3:30pm. Patient in agreement of date and time of appointment. She prefers a later afternoon or early morning appointment due to her work schedule.  Patient states she is still drinking a variety of herbal teas. Instructed patient to stop drinking tea leading up to surgery. Patient verbalized understanding and states she will not drink anymore.  Patient reports at her pre-admission appointment this morning that the IR procedure was not listed. She wants to confirm that this is still scheduled. Assured patient that this is still scheduled for 07/08/21.  Patient to send FMLA paperwork to be completed. Fax number provided to patient [phone]). Instructed patient to call with any questions or concerns.

## 2021-07-03 NOTE — Progress Notes (Signed)
COVID test- 12/12  PCP - Dr. Lesle Chris Cardiologist - none  Chest x-ray - no EKG - no Stress Test - no ECHO - no Cardiac Cath - no Pacemaker/ICD device last checked:NA  Sleep Study - no CPAP -   Fasting Blood Sugar - NA Checks Blood Sugar _____ times a day  Blood Thinner Instructions:NA Aspirin Instructions: Last Dose:  Anesthesia review: yes  Patient denies shortness of breath, fever, cough and chest pain at PAT appointment Pt is being admitted on 12/14 for an IR procedure the surgery on 12/15.PAT visit was completed, labs drawn and Pt was given ERAS drinks. Melissa CrossPA was called and she said not to give the drinks. I called Pt back and told her not to bring them to the hospital.  Patient verbalized understanding of instructions that were given to them at the PAT appointment. Patient was also instructed that they will need to review over the PAT instructions again at home before surgery. yes

## 2021-07-03 NOTE — Progress Notes (Signed)
Plan for repeat CBC on Tues before meeting with Dede Query PA with Med Onc to re-evaluate her white blood cell count prior to surgery.

## 2021-07-06 ENCOUNTER — Other Ambulatory Visit: Payer: Self-pay | Admitting: Gynecologic Oncology

## 2021-07-06 ENCOUNTER — Telehealth: Payer: Self-pay

## 2021-07-06 ENCOUNTER — Telehealth: Payer: Self-pay | Admitting: *Deleted

## 2021-07-06 ENCOUNTER — Ambulatory Visit: Payer: 59 | Admitting: Gynecologic Oncology

## 2021-07-06 NOTE — Telephone Encounter (Signed)
Fax FMLA to [employer]

## 2021-07-06 NOTE — Telephone Encounter (Signed)
Attempted to reach patient regarding her missed appointment with Joylene John, NP today 07/06/21. Unable to reach patient and unable to leave message.

## 2021-07-06 NOTE — Progress Notes (Deleted)
Patient here for a pre-operative appointment prior to her scheduled surgery on August 03, 2022. She is scheduled for robotic assisted laparoscopic unilateral salpingectomy versus unilateral salpingo-oophorectomy, possible bilateral salpingo-oophorectomy, possible total hysterectomy, possible staging. She has her pre-admission testing appointment on 08/02/22 at [place].  The surgery was discussed in detail.  See after visit summary for additional details. Visual aids used to discuss items related to surgery including sequential compression stockings, foley catheter, IV pump, multi-modal pain regimen including tylenol, photo of the surgical robot, female reproductive system to discuss surgery in detail.      Discussed post-op pain management in detail including the aspects of the enhanced recovery pathway.  Advised her that a new prescription would be sent in for tramadol and it is only to be used for after her upcoming surgery.  We discussed the use of tylenol post-op and to monitor for a maximum of 4,000 mg in a 24 hour period.  Also prescribed sennakot to be used after surgery and to hold if having loose stools.  Discussed bowel regimen in detail.     Discussed the use of SCDs and measures to take at home to prevent DVT including frequent mobility.  Reportable signs and symptoms of DVT discussed. Post-operative instructions discussed and expectations for after surgery. Incisional care discussed as well including reportable signs and symptoms including erythema, drainage, wound separation.     10 minutes spent with the patient and preparing information.  Verbalizing understanding of material discussed. No needs or concerns voiced at the end of the visit.   Advised patient to call for any needs.  Advised that her post-operative medications had been prescribed and could be picked up at any time.    This appointment is included in the global surgical bundle as pre-operative teaching and has no charge.    

## 2021-07-07 ENCOUNTER — Inpatient Hospital Stay: Payer: 59

## 2021-07-07 ENCOUNTER — Other Ambulatory Visit: Payer: Self-pay | Admitting: Radiology

## 2021-07-07 ENCOUNTER — Other Ambulatory Visit: Payer: Self-pay

## 2021-07-07 ENCOUNTER — Inpatient Hospital Stay: Payer: 59 | Attending: Gynecologic Oncology | Admitting: Physician Assistant

## 2021-07-07 ENCOUNTER — Encounter: Payer: Self-pay | Admitting: Hematology and Oncology

## 2021-07-07 ENCOUNTER — Inpatient Hospital Stay (HOSPITAL_BASED_OUTPATIENT_CLINIC_OR_DEPARTMENT_OTHER): Payer: 59 | Admitting: Gynecologic Oncology

## 2021-07-07 VITALS — BP 139/83 | HR 81 | Temp 97.2°F | Resp 18 | Wt 192.3 lb

## 2021-07-07 VITALS — BP 143/87 | HR 100 | Temp 98.7°F | Resp 20 | Ht 64.0 in | Wt 193.0 lb

## 2021-07-07 DIAGNOSIS — N920 Excessive and frequent menstruation with regular cycle: Secondary | ICD-10-CM | POA: Diagnosis present

## 2021-07-07 DIAGNOSIS — D500 Iron deficiency anemia secondary to blood loss (chronic): Secondary | ICD-10-CM | POA: Insufficient documentation

## 2021-07-07 DIAGNOSIS — D72819 Decreased white blood cell count, unspecified: Secondary | ICD-10-CM

## 2021-07-07 DIAGNOSIS — D259 Leiomyoma of uterus, unspecified: Secondary | ICD-10-CM

## 2021-07-07 LAB — CBC WITH DIFFERENTIAL (CANCER CENTER ONLY)
Abs Immature Granulocytes: 0.01 10*3/uL (ref 0.00–0.07)
Basophils Absolute: 0 10*3/uL (ref 0.0–0.1)
Basophils Relative: 1 %
Eosinophils Absolute: 0 10*3/uL (ref 0.0–0.5)
Eosinophils Relative: 0 %
HCT: 38.9 % (ref 36.0–46.0)
Hemoglobin: 12 g/dL (ref 12.0–15.0)
Immature Granulocytes: 0 %
Lymphocytes Relative: 37 %
Lymphs Abs: 1.8 10*3/uL (ref 0.7–4.0)
MCH: 24.2 pg — ABNORMAL LOW (ref 26.0–34.0)
MCHC: 30.8 g/dL (ref 30.0–36.0)
MCV: 78.6 fL — ABNORMAL LOW (ref 80.0–100.0)
Monocytes Absolute: 0.2 10*3/uL (ref 0.1–1.0)
Monocytes Relative: 5 %
Neutro Abs: 2.7 10*3/uL (ref 1.7–7.7)
Neutrophils Relative %: 57 %
Platelet Count: 296 10*3/uL (ref 150–400)
RBC: 4.95 MIL/uL (ref 3.87–5.11)
RDW: 29 % — ABNORMAL HIGH (ref 11.5–15.5)
WBC Count: 4.8 10*3/uL (ref 4.0–10.5)
nRBC: 0 % (ref 0.0–0.2)

## 2021-07-07 LAB — SARS CORONAVIRUS 2 (TAT 6-24 HRS): SARS Coronavirus 2: NEGATIVE

## 2021-07-07 MED ORDER — SENNOSIDES-DOCUSATE SODIUM 8.6-50 MG PO TABS
2.0000 | ORAL_TABLET | Freq: Every day | ORAL | 0 refills | Status: AC
Start: 2021-07-07 — End: ?

## 2021-07-07 MED ORDER — IBUPROFEN 800 MG PO TABS
800.0000 mg | ORAL_TABLET | Freq: Three times a day (TID) | ORAL | 0 refills | Status: AC | PRN
Start: 2021-07-07 — End: ?

## 2021-07-07 MED ORDER — TRAMADOL HCL 50 MG PO TABS: 50.0000 mg | ORAL_TABLET | Freq: Four times a day (QID) | ORAL | 0 refills | Status: AC | PRN

## 2021-07-07 MED ORDER — ENOXAPARIN SODIUM 40 MG/0.4ML IJ SOSY: 40.0000 mg | PREFILLED_SYRINGE | INTRAMUSCULAR | 0 refills | Status: AC

## 2021-07-07 NOTE — Telephone Encounter (Signed)
Spoke with Betty Gutierrez this morning. Patient missed her appointment with Joylene John, NP yesterday. Appointment rescheduled for today after her appointment with Dede Query at [facility]. Patient is in agreement of appointment date and time.

## 2021-07-07 NOTE — Patient Instructions (Signed)
You will be having a repeat CBC today before your appointment with Betty Hodgkins PA to look at your white blood cell count again.   You are also scheduled for the uterine artery embolization on Wednesday of this week and you will spend the night after this procedure with your surgery being the next day.   Preparing for your Surgery  Plan for surgery on July 09, 2021 with Dr. Jeral Gutierrez at [facility] [facility] will be scheduled for robotic assisted total laparoscopic hysterectomy (removal of the uterus and cervix), bilateral salpingectomy (removal of the fallopian tubes), possible ovarian cystectomy (removal of a cyst from the ovary) vs oophorectomy (removal of one ovary), mini laparotomy for specimen delivery, possible application of the cell saver.   Pre-operative Testing -(DONE) You will receive a phone call from presurgical testing at [facility] to arrange for a pre-operative appointment and lab work.  -Bring your insurance card, copy of an advanced directive if applicable, medication list  -At that visit, you will be asked to sign a consent for a possible blood transfusion in case a transfusion becomes necessary during surgery.  The need for a blood transfusion is rare but having consent is a necessary part of your care.     -You should not be taking blood thinners or aspirin at least ten days prior to surgery unless instructed by your surgeon.  -Do not take supplements such as fish oil (omega 3), red yeast rice, turmeric before your surgery. You want to avoid medications with aspirin in them including headache powders such as BC or Goody's), Excedrin migraine.  Day Before Surgery at [place] will be asked to take in a light diet the day before surgery. You will be advised you can have clear liquids up until 3 hours before your surgery.    Eat a light diet the day before surgery.  Examples including soups, broths, toast, yogurt, mashed potatoes.  AVOID GAS PRODUCING FOODS.  Things to avoid include carbonated beverages (fizzy beverages, sodas), raw fruits and raw vegetables (uncooked), or beans.   If your bowels are filled with gas, your surgeon will have difficulty visualizing your pelvic organs which increases your surgical risks.  Your role in recovery Your role is to become active as soon as directed by your doctor, while still giving yourself time to heal.  Rest when you feel tired. You will be asked to do the following in order to speed your recovery:  - Cough and breathe deeply. This helps to clear and expand your lungs and can prevent pneumonia after surgery.  - [place]. Do mild physical activity. Walking or moving your legs help your circulation and body functions return to normal. Do not try to get up or walk alone the first time after surgery.   -If you develop swelling on one leg or the other, pain in the back of your leg, redness/warmth in one of your legs, please call the office or go to the Emergency Room to have a doppler to rule out a blood clot. For shortness of breath, chest pain-seek care in the Emergency Room as soon as possible. - Actively manage your pain. Managing your pain lets you move in comfort. We will ask you to rate your pain on a scale of zero to 10. It is your responsibility to tell your doctor or nurse where and how much you hurt so your pain can be treated.  Special Considerations -Your final pathology results from surgery  should be available around one week after surgery and the results will be relayed to you when available.  -Dr. Lahoma Crocker is the surgeon that assists your GYN Oncologist with surgery.  If you end up staying the night, the next day after your surgery you will either see Dr. Berline Lopes or Dr. Lahoma Crocker.  -FMLA forms can be faxed to [phone] and please allow 5-7 business days for completion.  Pain Management After Surgery -You have been prescribed your pain medication and bowel  regimen medications before surgery so that you can have these available when you are discharged from the hospital. The pain medication is for use ONLY AFTER surgery and a new prescription will not be given.   -Make sure that you have Tylenol and Ibuprofen at home to use on a regular basis after surgery for pain control. We recommend alternating the medications every hour to six hours since they work differently and are processed in the body differently for pain relief.  -Review the attached handout on narcotic use and their risks and side effects.   Bowel Regimen -You have been prescribed Sennakot-S to take nightly to prevent constipation especially if you are taking the narcotic pain medication intermittently.  It is important to prevent constipation and drink adequate amounts of liquids. You can stop taking this medication when you are not taking pain medication and you are back on your normal bowel routine.  Risks of Surgery Risks of surgery are low but include bleeding, infection, damage to surrounding structures, re-operation, blood clots, and very rarely death.   Blood Transfusion Information (For the consent to be signed before surgery)  We will be checking your blood type before surgery so in case of emergencies, we will know what type of blood you would need.                                            WHAT IS A BLOOD TRANSFUSION?  A transfusion is the replacement of blood or some of its parts. Blood is made up of multiple cells which provide different functions. Red blood cells carry oxygen and are used for blood loss replacement. White blood cells fight against infection. Platelets control bleeding. Plasma helps clot blood. Other blood products are available for specialized needs, such as hemophilia or other clotting disorders. BEFORE THE TRANSFUSION  Who gives blood for transfusions?  You may be able to donate blood to be used at a later date on yourself (autologous  donation). Relatives can be asked to donate blood. This is generally not any safer than if you have received blood from a stranger. The same precautions are taken to ensure safety when a relative's blood is donated. Healthy volunteers who are fully evaluated to make sure their blood is safe. This is blood bank blood. Transfusion therapy is the safest it has ever been in the practice of medicine. Before blood is taken from a donor, a complete history is taken to make sure that person has no history of diseases nor engages in risky social behavior (examples are intravenous drug use or sexual activity with multiple partners). The donor's travel history is screened to minimize risk of transmitting infections, such as malaria. The donated blood is tested for signs of infectious diseases, such as HIV and hepatitis. The blood is then tested to be sure it is compatible with you in order to minimize  the chance of a transfusion reaction. If you or a relative donates blood, this is often done in anticipation of surgery and is not appropriate for emergency situations. It takes many days to process the donated blood. RISKS AND COMPLICATIONS Although transfusion therapy is very safe and saves many lives, the main dangers of transfusion include:  Getting an infectious disease. Developing a transfusion reaction. This is an allergic reaction to something in the blood you were given. Every precaution is taken to prevent this. The decision to have a blood transfusion has been considered carefully by your caregiver before blood is given. Blood is not given unless the benefits outweigh the risks.  AFTER SURGERY INSTRUCTIONS  Return to work: 4-6 weeks if applicable  Activity: 1. Be up and out of the bed during the day.  Take a nap if needed.  You may walk up steps but be careful and use the hand rail.  Stair climbing will tire you more than you think, you may need to stop part way and rest.   2. No lifting or straining  for 6 weeks over 10 pounds. No pushing, pulling, straining for 6 weeks.  3. No driving for 1 week(s).  Do not drive if you are taking narcotic pain medicine and make sure that your reaction time has returned.   4. You can shower as soon as the next day after surgery. Shower daily.  Use your regular soap and water (not directly on the incision) and pat your incision(s) dry afterwards; don't rub.  No tub baths or submerging your body in water until cleared by your surgeon. If you have the soap that was given to you by pre-surgical testing that was used before surgery, you do not need to use it afterwards because this can irritate your incisions.   5. No sexual activity and nothing in the vagina for 8 weeks.  6. You may experience a small amount of clear drainage from your incisions, which is normal.  If the drainage persists, increases, or changes color please call the office.  7. Do not use creams, lotions, or ointments such as neosporin on your incisions after surgery until advised by your surgeon because they can cause removal of the dermabond glue on your incisions.    8. You may experience vaginal spotting after surgery or around the 6-8 week mark from surgery when the stitches at the top of the vagina begin to dissolve.  The spotting is normal but if you experience heavy bleeding, call our office.  9. Take Tylenol or ibuprofen first for pain and only use narcotic pain medication for severe pain not relieved by the Tylenol or Ibuprofen.  Monitor your Tylenol intake to a max of 4,000 mg in a 24 hour period. You can alternate these medications after surgery.  Diet: 1. Low sodium Heart Healthy Diet is recommended but you are cleared to resume your normal (before surgery) diet after your procedure.  2. It is safe to use a laxative, such as Miralax or Colace, if you have difficulty moving your bowels. You have been prescribed Sennakot-S to take at bedtime every evening after surgery to keep bowel  movements regular and to prevent constipation.    Wound Care: 1. Keep clean and dry.  Shower daily.  Reasons to call the Doctor: Fever - Oral temperature greater than 100.4 degrees Fahrenheit Foul-smelling vaginal discharge Difficulty urinating Nausea and vomiting Increased pain at the site of the incision that is unrelieved with pain medicine. Difficulty breathing with  or without chest pain New calf pain especially if only on one side Sudden, continuing increased vaginal bleeding with or without clots.   Contacts: For questions or concerns you should contact:  Dr. Jeral Gutierrez at [phone]  Betty John, NP at [phone]  After Hours: call [phone] and have the GYN Oncologist paged/contacted (after 5 pm or on the weekends).  Messages sent via mychart are for non-urgent matters and are not responded to after hours so for urgent needs, please call the after hours number.

## 2021-07-07 NOTE — Progress Notes (Signed)
Patient here for a pre-operative appointment prior to her scheduled surgery on July 09, 2021. She is scheduled for robotic assisted total laparoscopic hysterectomy, bilateral salpingectomy, possible ovarian cystectomy vs oophorectomy, mini laparotomy for specimen delivery, possible application of the cell saver. She had her pre-admission testing appointment on 07/03/2021 at [facility].  The surgery was discussed in detail.  See after visit summary for additional details. Visual aids used to discuss items related to surgery including sequential compression stockings, foley catheter, IV pump, multi-modal pain regimen including tylenol, photo of the surgical robot, female reproductive system to discuss surgery in detail.    She is scheduled to undergo uterine artery embolization with IR on 07/08/2021 with an overnight stay in the hospital with plans for surgery the following day.    Discussed post-op pain management in detail including the aspects of the enhanced recovery pathway.  Advised her that a new prescription would be sent in for tramadol and it is only to be used for after her upcoming surgery.  We discussed the use of tylenol post-op and to monitor for a maximum of 4,000 mg in a 24 hour period.  Also prescribed sennakot to be used after surgery and to hold if having loose stools.  Discussed bowel regimen in detail.     Discussed the use of heparin pre-op, SCDs, and measures to take at home to prevent DVT including frequent mobility.  Reportable signs and symptoms of DVT discussed. Post-operative instructions discussed and expectations for after surgery. Incisional care discussed as well including reportable signs and symptoms including erythema, drainage, wound separation.   Discussed repeat CBC results from today. We will also have her meet with Joyice Faster, PA with hematology after this appointment for follow up.    30 minutes spent with the patient and preparing documentation.  Verbalizing  understanding of material discussed. No needs or concerns voiced at the end of the visit. Advised patient to call for any needs.  Advised that her post-operative medications had been prescribed and could be picked up at any time.    This appointment is included in the global surgical bundle as pre-operative teaching and has no charge.

## 2021-07-07 NOTE — H&P (Signed)
Chief Complaint: Patient was seen in consultation today for symptomatic uterine fibroids.  Referring Physician(s): Cross,Melissa D  Supervising Physician: Sandi Mariscal  Patient Status: [facility] - Out-pt  History of Present Illness: Betty Gutierrez is a 50 y.o. female with a past medical history significant for anemia, myomectomy (2000) and previous UFE (2014 - [facility]) who presents today for uterine fibroid embolization prior to definitve hysterectomy. Betty Gutierrez was seen in consultation by Dr. Pascal Lux on 06/11/21 - please refer to this note for full details. Briefly, Betty Gutierrez is planned to under hysterectomy on 12/15 secondary to symptomatic fibroids as well as endometrial hyperplasia without atypia. She was referred to IR for UFE prior to hysterectomy for which she presents today.  Betty Gutierrez denies any complaints today, she understands the procedure today including hospital admission and planned hysterectomy tomorrow with GYN. She reports having about 12 oz of water at 3 am but has otherwise been NPO.  Past Medical History:  Diagnosis Date   Anemia 06/30/2021   last iron transfusion   Family history of pulmonary embolism 07/31/2017   Multiple thyroid nodules     Past Surgical History:  Procedure Laterality Date   Cesarian  N/A 2004   IR RADIOLOGIST EVAL & MGMT  06/11/2021   MYOMECTOMY N/A 1999   uterine fibroid emoblization  N/A 2014    Allergies: Hydromorphone  Medications: Prior to Admission medications   Medication Sig Start Date End Date Taking? Authorizing Provider  iron polysaccharides (NIFEREX) 150 MG capsule Take 150 mg by mouth 2 (two) times a week. 02/05/21   [provider]     Family History  Problem Relation Age of Onset   Pulmonary embolism Mother        following international flight   Breast cancer Mother    Prostate cancer Father    Pulmonary embolism Sister    Pulmonary embolism Sister    Pulmonary embolism Maternal Aunt        Twin  of the patient's mother, also occurred following and international flight   Colon cancer Neg Hx    Ovarian cancer Neg Hx    Endometrial cancer Neg Hx    Pancreatic cancer Neg Hx     Social History   Socioeconomic History   Marital status: Divorced    Spouse name: Not on file   Number of children: Not on file   Years of education: Not on file   Highest education level: Not on file  Occupational History   Not on file  Tobacco Use   Smoking status: Never   Smokeless tobacco: Never  Vaping Use   Vaping Use: Never used  Substance and Sexual Activity   Alcohol use: Yes    Alcohol/week: 1.0 standard drink    Types: 1 Glasses of wine per week   Drug use: No   Sexual activity: Not Currently  Other Topics Concern   Not on file  Social History Narrative   Not on file   Social Determinants of Health   Financial Resource Strain: Not on file  Food Insecurity: Not on file  Transportation Needs: Not on file  Physical Activity: Not on file  Stress: Not on file  Social Connections: Not on file     Review of Systems: A 12 point ROS discussed and pertinent positives are indicated in the HPI above.  All other systems are negative.  Review of Systems  Constitutional:  Negative for chills and fever.  Respiratory:  Negative for cough and  shortness of breath.   Cardiovascular:  Negative for chest pain.  Gastrointestinal:  Negative for abdominal pain, diarrhea, nausea and vomiting.  Musculoskeletal:  Negative for back pain.  Neurological:  Negative for headaches.   Vital Signs: BP 133/87    Pulse 95    Temp 98.3 F (36.8 C) (Oral)    Resp 18    LMP 06/08/2021    SpO2 100%   Physical Exam Vitals reviewed.  Constitutional:      General: She is not in acute distress. HENT:     Head: Normocephalic.     Mouth/Throat:     Mouth: Mucous membranes are moist.     Pharynx: Oropharynx is clear. No oropharyngeal exudate or posterior oropharyngeal erythema.  Cardiovascular:     Rate and  Rhythm: Normal rate and regular rhythm.  Pulmonary:     Effort: Pulmonary effort is normal.     Breath sounds: Normal breath sounds.  Abdominal:     General: There is no distension.     Palpations: Abdomen is soft.     Tenderness: There is no abdominal tenderness.  Skin:    General: Skin is warm and dry.  Neurological:     Mental Status: She is alert and oriented to person, place, and time.  Psychiatric:        Mood and Affect: Mood normal.        Behavior: Behavior normal.        Thought Content: Thought content normal.        Judgment: Judgment normal.     MD Evaluation Airway: WNL Heart: WNL Abdomen: WNL Chest/ Lungs: WNL ASA  Classification: 2 Mallampati/Airway Score: One   Imaging: IR Radiologist Eval & Mgmt  Result Date: 06/11/2021 Please refer to notes tab for details about interventional procedure. (Op Note)   Labs:  CBC: Recent Labs    05/13/21 1615 05/21/21 1616 07/03/21 0923  WBC 4.6 4.9 3.4*  HGB 8.2* 8.3* 11.2*  HCT 28.9* 29.0* 38.3  PLT 319 281 241    COAGS: No results for input(s): INR, APTT in the last 8760 hours.  BMP: Recent Labs    05/21/21 1616 07/03/21 0923  NA 139 136  K 3.7 3.8  CL 106 106  CO2 23 22  GLUCOSE 81 85  BUN 9 11  CALCIUM 9.0 8.6*  CREATININE 0.77 0.69  GFRNONAA >60 >60    LIVER FUNCTION TESTS: Recent Labs    05/21/21 1616 07/03/21 0923  BILITOT 0.3 0.3  AST 18 19  ALT 14 17  ALKPHOS 40 33*  PROT 7.4 7.4  ALBUMIN 3.8 4.1    TUMOR MARKERS: No results for input(s): AFPTM, CEA, CA199, CHROMGRNA in the last 8760 hours.  Assessment and Plan:  50 y/o F with history of symptomatic uterine fibroids planned for hysterectomy 12/15 who presents to IR today for pre-op uterine fibroid embolization.  Patient to be admitted by IR service for overnight observation tonight with plans for GYN service to assume care and discharge planning post hysterectomy on 12/15.  The Risks and benefits of embolization were  discussed with the patient including, but not limited to bleeding, infection, vascular injury, post operative pain, or contrast induced renal failure.  This procedure involves the use of X-rays and because of the nature of the planned procedure, it is possible that we will have prolonged use of X-ray fluoroscopy.  Potential radiation risks to you include (but are not limited to) the following: - A slightly elevated risk for  cancer several years later in life. This risk is typically less than 0.5% percent. This risk is low in comparison to the normal incidence of human cancer, which is 33% for women and 50% for men according to the [employer]. - Radiation induced injury can include skin redness, resembling a rash, tissue breakdown / ulcers and hair loss (which can be temporary or permanent).   The likelihood of either of these occurring depends on the difficulty of the procedure and whether you are sensitive to radiation due to previous procedures, disease, or genetic conditions.   IF your procedure requires a prolonged use of radiation, you will be notified and given written instructions for further action.  It is your responsibility to monitor the irradiated area for the 2 weeks following the procedure and to notify your physician if you are concerned that you have suffered a radiation induced injury.    All of the patient's questions were answered, patient is agreeable to proceed.  Consent signed and in chart.   Thank you for this interesting consult.  I greatly enjoyed meeting Briya Lookabaugh and look forward to participating in their care.  A copy of this report was sent to the requesting provider on this date.  Electronically Signed: Joaquim Nam, PA-C 07/07/2021, 3:02 PM   I spent a total of  25 Minutes in face to face in clinical consultation, greater than 50% of which was counseling/coordinating care for uterine fibroid embolization.

## 2021-07-08 ENCOUNTER — Observation Stay (HOSPITAL_COMMUNITY)
Admission: RE | Admit: 2021-07-08 | Discharge: 2021-07-10 | Disposition: A | Discharge status: Home / Self Care | Payer: 59 | Source: Ambulatory Visit | Attending: Gynecologic Oncology | Admitting: Gynecologic Oncology

## 2021-07-08 ENCOUNTER — Encounter: Payer: Self-pay | Admitting: Hematology and Oncology

## 2021-07-08 ENCOUNTER — Ambulatory Visit (HOSPITAL_COMMUNITY)
Admission: RE | Admit: 2021-07-08 | Discharge: 2021-07-08 | Disposition: A | Discharge status: Home / Self Care | Payer: 59 | Source: Ambulatory Visit | Attending: Gynecologic Oncology | Admitting: Gynecologic Oncology

## 2021-07-08 ENCOUNTER — Other Ambulatory Visit: Payer: Self-pay | Admitting: Gynecologic Oncology

## 2021-07-08 ENCOUNTER — Encounter (HOSPITAL_COMMUNITY): Payer: Self-pay

## 2021-07-08 VITALS — BP 110/61 | HR 78 | Temp 98.7°F | Resp 18 | Ht 64.0 in | Wt 192.2 lb

## 2021-07-08 DIAGNOSIS — D219 Benign neoplasm of connective and other soft tissue, unspecified: Secondary | ICD-10-CM | POA: Diagnosis present

## 2021-07-08 DIAGNOSIS — Z9889 Other specified postprocedural states: Secondary | ICD-10-CM

## 2021-07-08 DIAGNOSIS — R1900 Intra-abdominal and pelvic swelling, mass and lump, unspecified site: Secondary | ICD-10-CM

## 2021-07-08 DIAGNOSIS — D500 Iron deficiency anemia secondary to blood loss (chronic): Secondary | ICD-10-CM

## 2021-07-08 DIAGNOSIS — D259 Leiomyoma of uterus, unspecified: Secondary | ICD-10-CM

## 2021-07-08 DIAGNOSIS — N8500 Endometrial hyperplasia, unspecified: Secondary | ICD-10-CM

## 2021-07-08 DIAGNOSIS — Z8249 Family history of ischemic heart disease and other diseases of the circulatory system: Secondary | ICD-10-CM

## 2021-07-08 HISTORY — PX: IR ANGIOGRAM PELVIS SELECTIVE OR SUPRASELECTIVE: IMG661

## 2021-07-08 HISTORY — PX: IR US GUIDE VASC ACCESS RIGHT: IMG2390

## 2021-07-08 HISTORY — PX: IR ANGIOGRAM SELECTIVE EACH ADDITIONAL VESSEL: IMG667

## 2021-07-08 HISTORY — PX: IR EMBO TUMOR ORGAN ISCHEMIA INFARCT INC GUIDE ROADMAPPING: IMG5449

## 2021-07-08 LAB — BASIC METABOLIC PANEL
Anion gap: 9 (ref 5–15)
BUN: 11 mg/dL (ref 6–20)
CO2: 21 mmol/L — ABNORMAL LOW (ref 22–32)
Calcium: 8.9 mg/dL (ref 8.9–10.3)
Chloride: 107 mmol/L (ref 98–111)
Creatinine, Ser: 0.58 mg/dL (ref 0.44–1.00)
GFR, Estimated: >60 mL/min (ref >60)
Glucose, Bld: 90 mg/dL (ref 70–99)
Potassium: 3.4 mmol/L — ABNORMAL LOW (ref 3.5–5.1)
Sodium: 137 mmol/L (ref 135–145)

## 2021-07-08 LAB — IRON AND TIBC
Iron: 59 ug/dL (ref 41–142)
Saturation Ratios: 18 % — ABNORMAL LOW (ref 21–57)
TIBC: 333 ug/dL (ref 236–444)
UIBC: 274 ug/dL (ref 120–384)

## 2021-07-08 LAB — HCG, SERUM, QUALITATIVE: Preg, Serum: NEGATIVE

## 2021-07-08 LAB — PROTIME-INR
INR: 0.9 (ref 0.8–1.2)
Prothrombin Time: 11.9 seconds (ref 11.4–15.2)

## 2021-07-08 LAB — FERRITIN: Ferritin: 154 ng/mL (ref 11–307)

## 2021-07-08 IMAGING — XA IR EMBO TUMOR ORGAN ISCHEMIA INFARCT INC GUIDE ROADMAPPING
3 series · 13 of 24 positions shown · non-contrast
Comparison: none

INDICATION: Symptomatic uterine fibroids. Please perform uterine artery
embolization prior to definitive hysterectomy.

[Series 1: ir embo tumor organ ischemia infarct inc · 2 of 11 slices shown (1 of 2)]
[im 1/11]
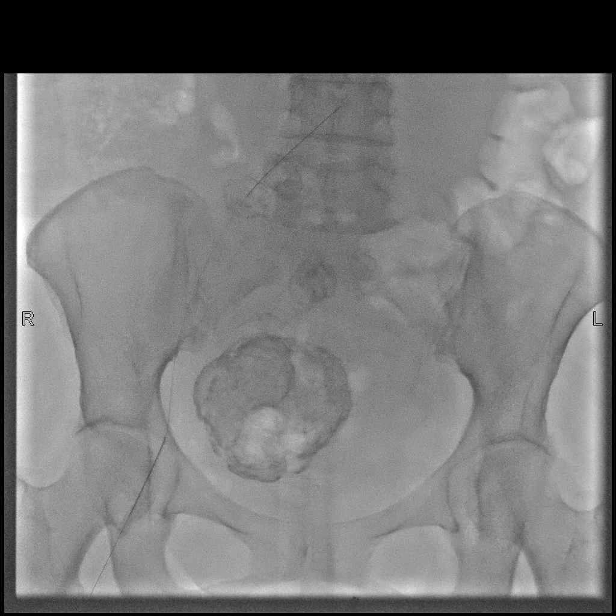
[im 6/11]
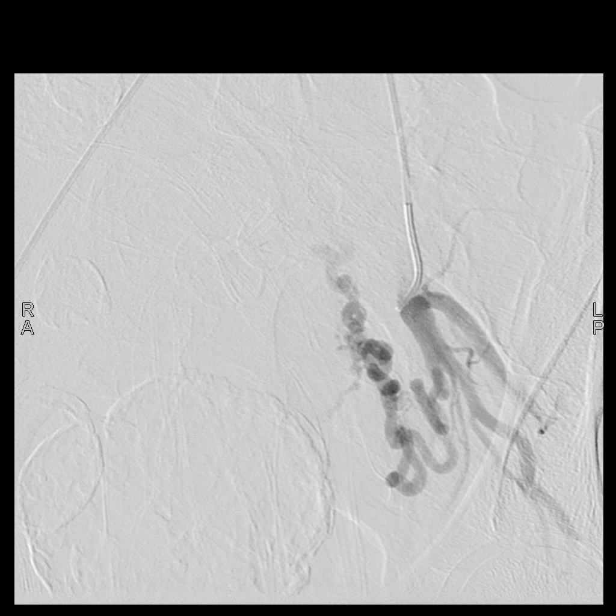

[Series 1: ir embo tumor organ ischemia infarct inc · 15 acquisitions, 10 frames shown (2 of 2)]
[im 1/15]
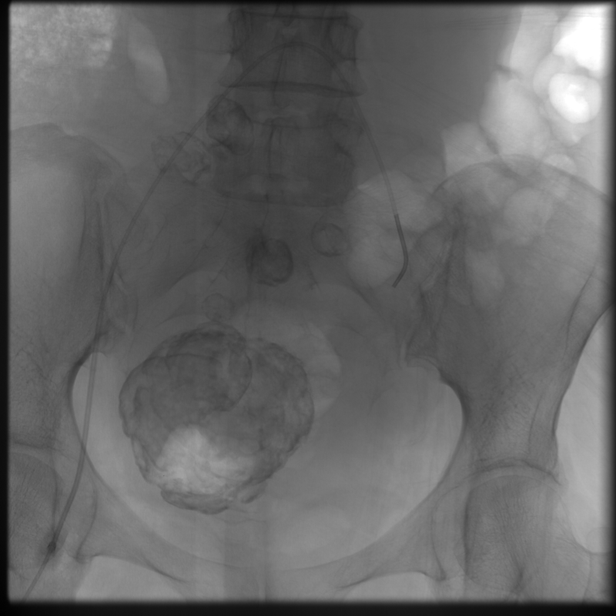
[im 2/15]
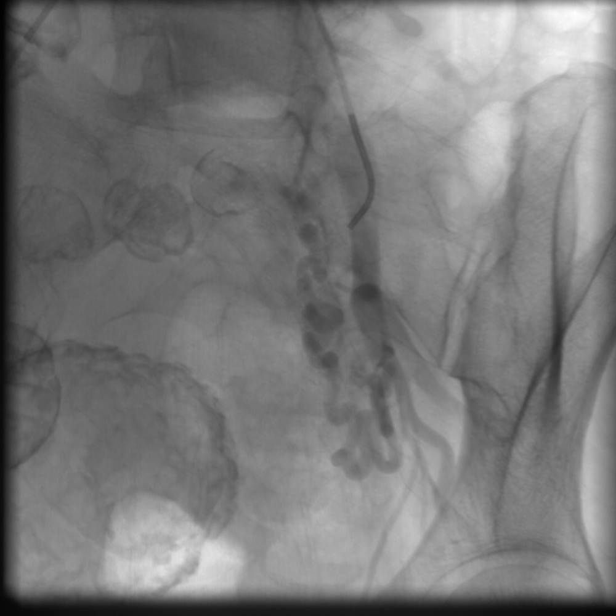
[im 3/15]
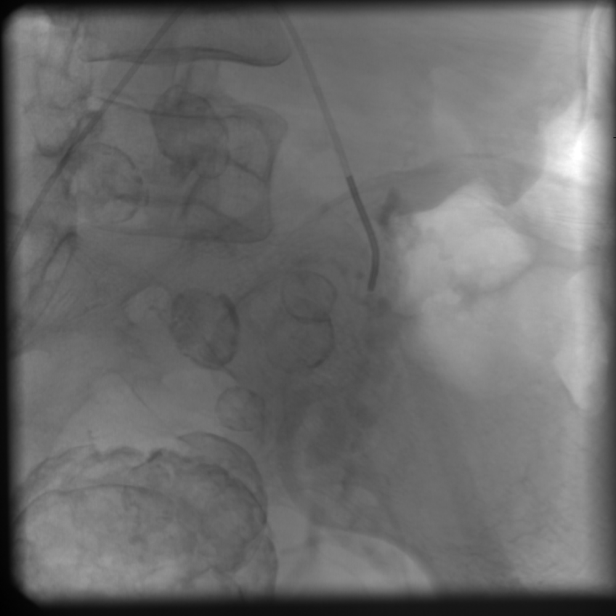
[im 5/15]
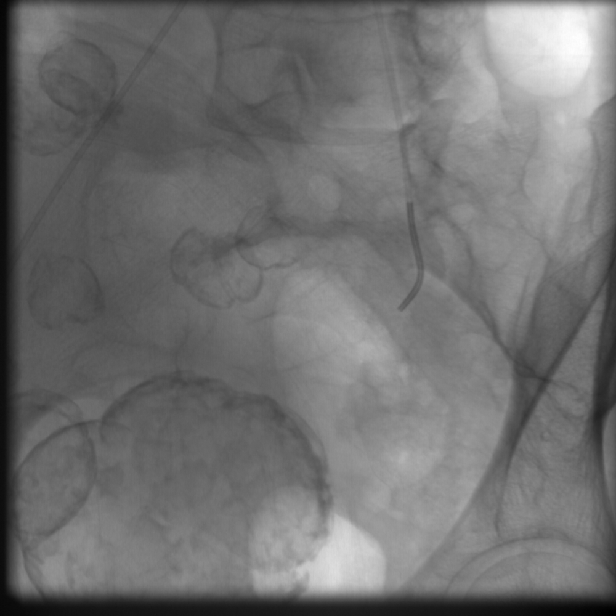
[im 6/15]
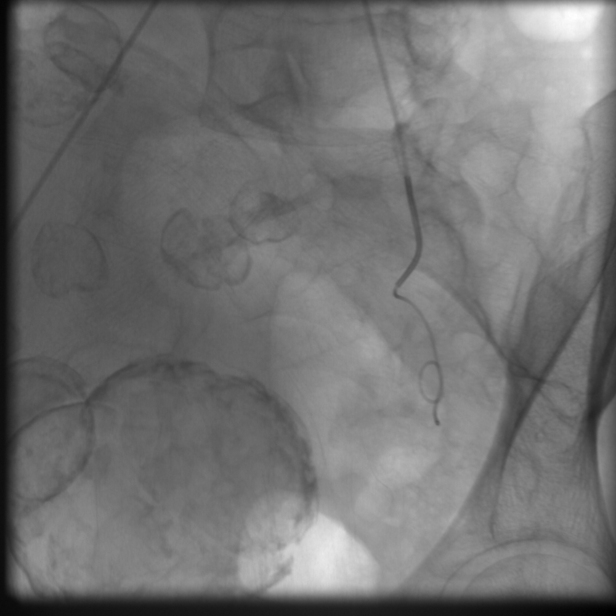
[im 8/15]
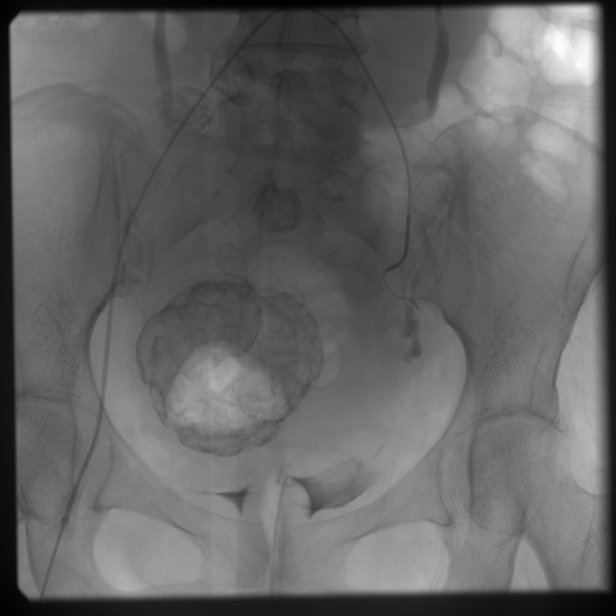
[im 9/15]
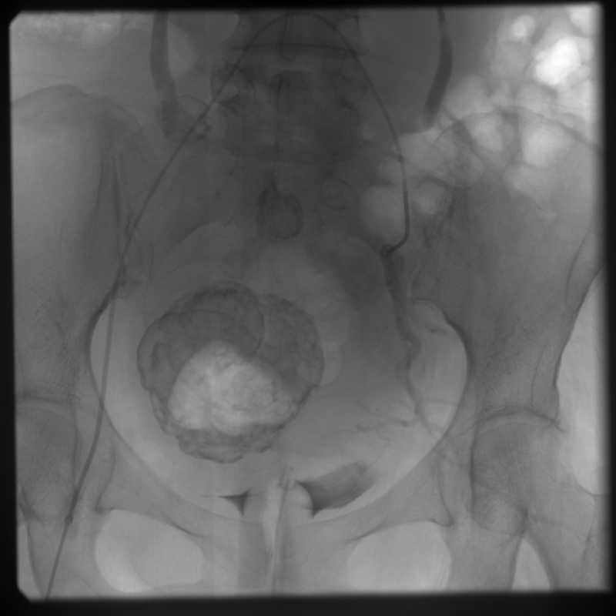
[im 10/15]
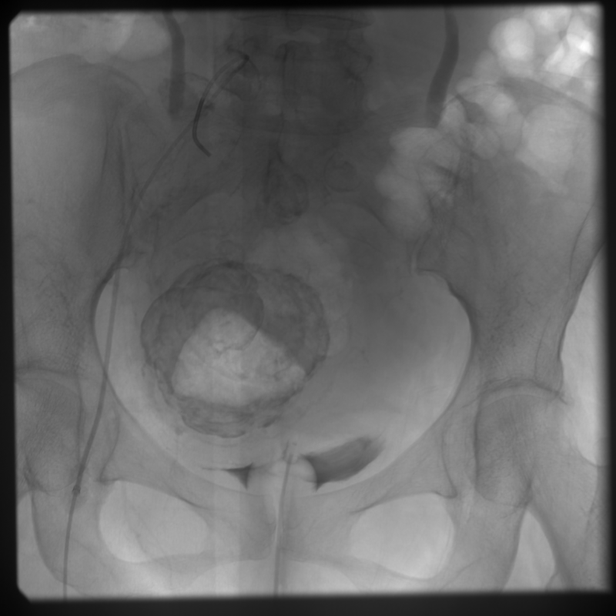
[im 12/15]
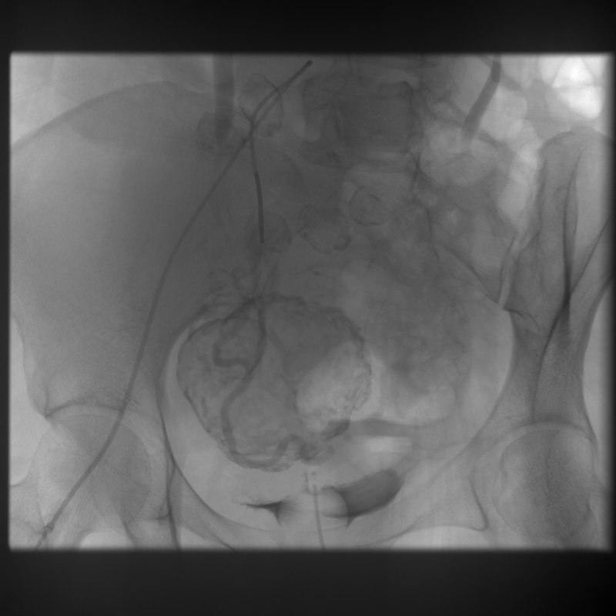
[im 13/15]
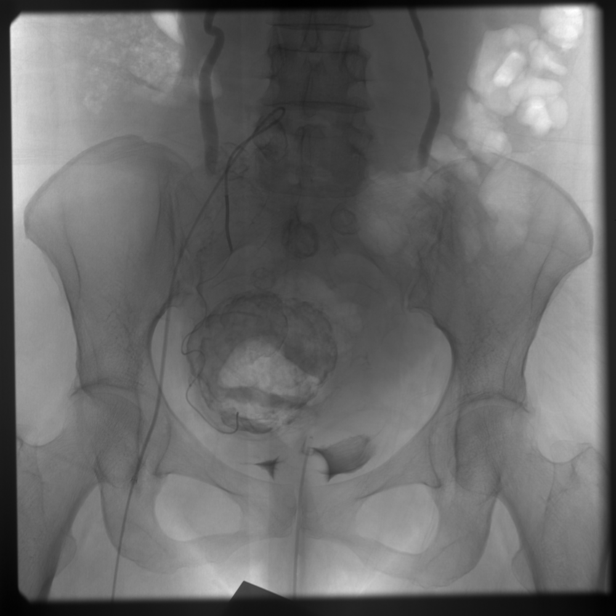

[Series 1: ir (id) (id)/(id)/(id) · 1 of 1 slices shown]
[im 1/1]
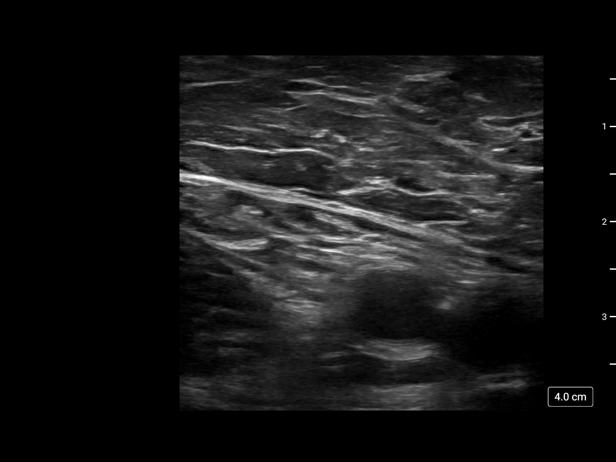

[13 of 24 positions shown; findings below may reference images not displayed]

EXAM:
1. ULTRASOUND GUIDANCE FOR ARTERIAL ACCESS
2. SELECTIVE BILATERAL INTERNAL ILIAC ARTERIOGRAMS
3. SUB SELECTIVE BILATERAL UTERINE ARTERY ARTERIOGRAM AND
PERCUTANEOUS PARTICLE EMBOLIZATION.

MEDICATIONS:
Toradol 30 mg IV; Ancef 2 gm IV. The antibiotic was administered
within one hour of the procedure

ANESTHESIA/SEDATION:
Moderate (conscious) sedation was employed during this procedure as
administered by the [REDACTED].

A total of Versed 4 mg and Fentanyl 200 mcg was administered
intravenously.

Moderate Sedation Time: 88 minutes. The patient's level of
consciousness and vital signs were monitored continuously by
radiology nursing throughout the procedure under my direct
supervision.

CONTRAST:  220 cc Omnipaque 300

FLUOROSCOPY TIME:  38 minutes, 36 seconds (2,267 mGy)

COMPLICATIONS:
None immediate.



The right femoral head was marked fluoroscopically. Under sterile
conditions and local anesthesia, the right common femoral artery
access was performed with a micropuncture needle. Under direct
ultrasound guidance, the right common femoral was accessed with a
micropuncture kit. An ultrasound image was saved for documentation
purposes. This allowed for placement of a 5-French vascular sheath.
A limited arteriogram was performed through the side arm of the
sheath confirming appropriate access within the right common femoral
artery.

The 5-French C2 catheter was utilized to select the contralateral
left internal iliac artery. Selective left internal iliac angiogram
was performed. The tortuous left uterine artery was identified.
Selective catheterization was performed of the left uterine artery
with a microcatheter and micro guide wire. A selective left uterine
angiogram was performed. This demonstrated patency of the left
uterine artery. Mild diffuse hypervascularity of the enlarged
fibroid uterus. Access was adequate for embolization. For
embolization, 2 vials of 500 - 700 micron Embospheres and 4 vials of
700-900 micron Embospheres were injected into the left uterine
artery. Post embolization angiogram confirms complete stasis of the
left uterine vascular territory. The Microcatheter was removed.

The C2 catheter was retracted and utilized to select the right
internal iliac artery. Selective right internal iliac angiogram was
performed. The patent right uterine artery was identified. For
selective catheterization, the micro catheter and guidewire were
utilized to select the right uterine artery. Selective right uterine
angiogram was performed. This demonstrated patency of the right
uterine artery. Catheter position was safe for embolization.
Embolization was performed to complete stasis with injection of 2
vials of 500 - 700 micron Embospheres and 2 vials of 700 - 900
micron Embospheres. Post embolization angiogram confirms complete
stasis of the right uterine vascular territory.

At this point, all wires, catheters and sheaths were removed from
the patient. Hemostasis was achieved at the right groin access site
with manual compression. Dressings were applied.

The patient tolerated the procedure well without immediate post
procedural complication.
FINDINGS: Bilateral internal iliac arteriograms demonstrates conventional
branching pattern with widely patent origins of the bilateral
uterine arteries.

Sub selective bilateral uterine arteriograms demonstrate co-dominant
supply to a hypertrophied myomatous uterus.

Completion arteriograms following bilateral uterine artery particle
embolization demonstrates a technically excellent result with stasis
of flow within the bilateral uterine vascular territories.
IMPRESSION: Successful bilateral uterine artery embolization (U F E).

PLAN:
Patient will be admitted overnight for PCA usage observation and
will undergo definitive hysterectomy tomorrow.

## 2021-07-08 MED ORDER — FENTANYL 50 MCG/ML IV PCA SOLN
INTRAVENOUS | Status: DC
  Administered 2021-07-09: 15 ug/h via INTRAVENOUS
  Administered 2021-07-09: 90 ug via INTRAVENOUS
  Filled 2021-07-08 (×4): qty 20

## 2021-07-08 MED ORDER — FENTANYL CITRATE (PF) 100 MCG/2ML IJ SOLN
INTRAMUSCULAR | Status: AC
  Filled 2021-07-08: qty 2

## 2021-07-08 MED ORDER — IOHEXOL 300 MG/ML  SOLN
100.0000 mL | Freq: Once | INTRAMUSCULAR | Status: AC | PRN
  Administered 2021-07-08: 12:00:00 20 mL via INTRA_ARTERIAL

## 2021-07-08 MED ORDER — DEXAMETHASONE SODIUM PHOSPHATE 10 MG/ML IJ SOLN
10.0000 mg | Freq: Once | INTRAMUSCULAR | Status: AC
  Administered 2021-07-08: 13:00:00 10 mg via INTRAVENOUS
  Filled 2021-07-08: qty 1

## 2021-07-08 MED ORDER — SODIUM CHLORIDE 0.9 % IV SOLN: INTRAVENOUS | Status: DC

## 2021-07-08 MED ORDER — LIDOCAINE HCL 1 % IJ SOLN
INTRAMUSCULAR | Status: AC
  Filled 2021-07-08: qty 20

## 2021-07-08 MED ORDER — CEFAZOLIN SODIUM-DEXTROSE 2-4 GM/100ML-% IV SOLN
INTRAVENOUS | Status: AC
  Administered 2021-07-08: 10:00:00 2 g via INTRAVENOUS
  Filled 2021-07-08: qty 100

## 2021-07-08 MED ORDER — SODIUM CHLORIDE 0.9% FLUSH: 9.0000 mL | INTRAVENOUS | Status: DC | PRN

## 2021-07-08 MED ORDER — DEXAMETHASONE SODIUM PHOSPHATE 10 MG/ML IJ SOLN
8.0000 mg | Freq: Four times a day (QID) | INTRAMUSCULAR | Status: AC
  Administered 2021-07-08 – 2021-07-09 (×2): 8 mg via INTRAVENOUS
  Filled 2021-07-08 (×2): qty 1

## 2021-07-08 MED ORDER — HEPARIN SODIUM (PORCINE) 5000 UNIT/ML IJ SOLN: 5000.0000 [IU] | INTRAMUSCULAR | Status: DC

## 2021-07-08 MED ORDER — FENTANYL CITRATE (PF) 100 MCG/2ML IJ SOLN
INTRAMUSCULAR | Status: DC | PRN
  Administered 2021-07-08 (×4): 50 ug via INTRAVENOUS

## 2021-07-08 MED ORDER — SCOPOLAMINE 1 MG/3DAYS TD PT72
1.0000 | MEDICATED_PATCH | TRANSDERMAL | Status: DC
  Administered 2021-07-09: 1.5 mg via TRANSDERMAL
  Filled 2021-07-08: qty 1

## 2021-07-08 MED ORDER — NALOXONE HCL 0.4 MG/ML IJ SOLN: 0.4000 mg | INTRAMUSCULAR | Status: DC | PRN

## 2021-07-08 MED ORDER — POVIDONE-IODINE 10 % EX SWAB
2.0000 "application " | Freq: Once | CUTANEOUS | Status: AC
  Administered 2021-07-09: 2 via TOPICAL

## 2021-07-08 MED ORDER — GABAPENTIN 300 MG PO CAPS
300.0000 mg | ORAL_CAPSULE | ORAL | Status: AC
  Administered 2021-07-09: 300 mg via ORAL
  Filled 2021-07-08: qty 1

## 2021-07-08 MED ORDER — KETOROLAC TROMETHAMINE 15 MG/ML IJ SOLN: 15.0000 mg | INTRAMUSCULAR | Status: DC

## 2021-07-08 MED ORDER — KETOROLAC TROMETHAMINE 30 MG/ML IJ SOLN
30.0000 mg | Freq: Once | INTRAMUSCULAR | Status: AC
  Administered 2021-07-08: 10:00:00 30 mg via INTRAVENOUS
  Filled 2021-07-08: qty 1

## 2021-07-08 MED ORDER — POLYSACCHARIDE IRON COMPLEX 150 MG PO CAPS: 150.0000 mg | ORAL_CAPSULE | ORAL | Status: DC

## 2021-07-08 MED ORDER — DIPHENHYDRAMINE HCL 50 MG/ML IJ SOLN: 12.5000 mg | Freq: Four times a day (QID) | INTRAMUSCULAR | Status: DC | PRN

## 2021-07-08 MED ORDER — DIPHENHYDRAMINE HCL 12.5 MG/5ML PO ELIX
12.5000 mg | ORAL_SOLUTION | Freq: Four times a day (QID) | ORAL | Status: DC | PRN
  Filled 2021-07-08: qty 5

## 2021-07-08 MED ORDER — KETOROLAC TROMETHAMINE 30 MG/ML IJ SOLN
30.0000 mg | Freq: Once | INTRAMUSCULAR | Status: AC
Start: 2021-07-08 — End: 2021-07-08
  Administered 2021-07-08: 13:00:00 30 mg via INTRAVENOUS
  Filled 2021-07-08: qty 1

## 2021-07-08 MED ORDER — DEXAMETHASONE SODIUM PHOSPHATE 4 MG/ML IJ SOLN
4.0000 mg | INTRAMUSCULAR | Status: DC
  Filled 2021-07-08: qty 1

## 2021-07-08 MED ORDER — CEFAZOLIN SODIUM-DEXTROSE 2-4 GM/100ML-% IV SOLN
2.0000 g | INTRAVENOUS | Status: AC
  Administered 2021-07-09 (×2): 2 g via INTRAVENOUS
  Filled 2021-07-08: qty 100

## 2021-07-08 MED ORDER — IOHEXOL 300 MG/ML  SOLN
100.0000 mL | Freq: Once | INTRAMUSCULAR | Status: AC | PRN
  Administered 2021-07-08: 12:00:00 100 mL via INTRA_ARTERIAL

## 2021-07-08 MED ORDER — LIDOCAINE HCL 1 % IJ SOLN
INTRAMUSCULAR | Status: DC | PRN
  Administered 2021-07-08 (×2): 5 mL

## 2021-07-08 MED ORDER — MIDAZOLAM HCL 2 MG/2ML IJ SOLN
INTRAMUSCULAR | Status: DC | PRN
  Administered 2021-07-08 (×3): 1 mg via INTRAVENOUS

## 2021-07-08 MED ORDER — SODIUM CHLORIDE 0.9% FLUSH
3.0000 mL | Freq: Two times a day (BID) | INTRAVENOUS | Status: DC
  Administered 2021-07-09: 3 mL via INTRAVENOUS

## 2021-07-08 MED ORDER — SODIUM CHLORIDE 0.9 % IV SOLN: 250.0000 mL | INTRAVENOUS | Status: DC | PRN

## 2021-07-08 MED ORDER — ONDANSETRON HCL 4 MG/2ML IJ SOLN
4.0000 mg | Freq: Four times a day (QID) | INTRAMUSCULAR | Status: DC | PRN
  Administered 2021-07-08: 19:00:00 4 mg via INTRAVENOUS
  Filled 2021-07-08: qty 2

## 2021-07-08 MED ORDER — SODIUM CHLORIDE 0.9% FLUSH: 3.0000 mL | INTRAVENOUS | Status: DC | PRN

## 2021-07-08 MED ORDER — HEPARIN SODIUM (PORCINE) 5000 UNIT/ML IJ SOLN
5000.0000 [IU] | INTRAMUSCULAR | Status: AC
  Administered 2021-07-09: 5000 [IU] via SUBCUTANEOUS
  Filled 2021-07-08 (×2): qty 1

## 2021-07-08 MED ORDER — ENOXAPARIN SODIUM 40 MG/0.4ML IJ SOSY
PREFILLED_SYRINGE | INTRAMUSCULAR | Status: AC
  Administered 2021-07-08: 13:00:00 40 mg via SUBCUTANEOUS
  Filled 2021-07-08: qty 0.4

## 2021-07-08 MED ORDER — SENNOSIDES-DOCUSATE SODIUM 8.6-50 MG PO TABS
2.0000 | ORAL_TABLET | Freq: Every day | ORAL | Status: DC
  Administered 2021-07-09: 2 via ORAL
  Filled 2021-07-08 (×3): qty 2

## 2021-07-08 MED ORDER — ENOXAPARIN SODIUM 40 MG/0.4ML IJ SOSY: 40.0000 mg | PREFILLED_SYRINGE | INTRAMUSCULAR | Status: DC

## 2021-07-08 MED ORDER — IBUPROFEN 400 MG PO TABS: 800.0000 mg | ORAL_TABLET | Freq: Three times a day (TID) | ORAL | Status: DC

## 2021-07-08 MED ORDER — MIDAZOLAM HCL 2 MG/2ML IJ SOLN
INTRAMUSCULAR | Status: AC
  Administered 2021-07-09: 2 mg via INTRAVENOUS
  Filled 2021-07-08: qty 4

## 2021-07-08 MED ORDER — KETOROLAC TROMETHAMINE 30 MG/ML IJ SOLN
30.0000 mg | Freq: Four times a day (QID) | INTRAMUSCULAR | Status: AC
  Administered 2021-07-08 – 2021-07-09 (×2): 30 mg via INTRAVENOUS
  Filled 2021-07-08 (×2): qty 1

## 2021-07-08 MED ORDER — ACETAMINOPHEN 500 MG PO TABS
1000.0000 mg | ORAL_TABLET | ORAL | Status: AC
  Administered 2021-07-09: 1000 mg via ORAL
  Filled 2021-07-08: qty 2

## 2021-07-08 MED ORDER — CEFAZOLIN SODIUM-DEXTROSE 2-4 GM/100ML-% IV SOLN: 2.0000 g | Freq: Once | INTRAVENOUS | Status: AC

## 2021-07-08 NOTE — Procedures (Signed)
Pre-procedure Diagnosis: Symptomatic uterine fibroids; Pre-hysterectomy Post-procedure Diagnosis: Same  Post bilateral uterine artery embolization.    Complications: None Immediate EBL: None  Keep right leg straight for 4 hrs (until 1530).    Signed: Sandi Mariscal Pager: [phone] 07/08/2021, 12:07 PM

## 2021-07-08 NOTE — Progress Notes (Signed)
MEDICATION-RELATED CONSULT NOTE   IR Procedure Consult - Anticoagulant/Antiplatelet PTA/Inpatient Med List Review by Pharmacist    Procedure: Post bilateral uterine artery embolization.    Completed: 12:07  Post-Procedural bleeding risk per IR MD assessment:  HIGH  Antithrombotic medications on inpatient or PTA profile prior to procedure:   none    Recommended restart time per IR Post-Procedure Guidelines:  Day +1   Other considerations:   Post-procedure orders for no VTE prophylaxis due to high risk of bleeding.  Also note pre-op orders for 12/15 for SQ heparin 120 minutes prior to surgery.   Plan:  SCDs only, see above  Peggyann Juba, PharmD, BCPS 07/08/2021 12:29 PM

## 2021-07-08 NOTE — Progress Notes (Signed)
Patient arrived to room 1308 in NAD, VS stable. Fentanyl PCA infusing. Patient oriented to room and call bell in reach.

## 2021-07-08 NOTE — Progress Notes (Signed)
This encounter was created in error - please disregard.  Patient no showed to this appointment.

## 2021-07-08 NOTE — Progress Notes (Signed)
Report called to [facility] on 1300. Patient transferred to 1308.

## 2021-07-09 ENCOUNTER — Encounter (HOSPITAL_COMMUNITY): Payer: Self-pay

## 2021-07-09 ENCOUNTER — Encounter: Payer: Self-pay | Admitting: Hematology and Oncology

## 2021-07-09 ENCOUNTER — Observation Stay (HOSPITAL_COMMUNITY): Payer: 59 | Admitting: Anesthesiology

## 2021-07-09 ENCOUNTER — Ambulatory Visit (HOSPITAL_COMMUNITY): Admission: RE | Admit: 2021-07-09 | Payer: 59 | Source: Ambulatory Visit | Admitting: Gynecologic Oncology

## 2021-07-09 ENCOUNTER — Encounter (HOSPITAL_COMMUNITY)
Admission: RE | Disposition: A | Discharge status: Home / Self Care | Payer: Self-pay | Source: Ambulatory Visit | Attending: Gynecologic Oncology

## 2021-07-09 DIAGNOSIS — D259 Leiomyoma of uterus, unspecified: Secondary | ICD-10-CM

## 2021-07-09 DIAGNOSIS — D500 Iron deficiency anemia secondary to blood loss (chronic): Secondary | ICD-10-CM

## 2021-07-09 DIAGNOSIS — N8500 Endometrial hyperplasia, unspecified: Secondary | ICD-10-CM

## 2021-07-09 DIAGNOSIS — Z9889 Other specified postprocedural states: Secondary | ICD-10-CM

## 2021-07-09 HISTORY — PX: ROBOTIC ASSISTED LAPAROSCOPIC HYSTERECTOMY AND SALPINGECTOMY: SHX6379

## 2021-07-09 LAB — ABO/RH: ABO/RH(D): A POS

## 2021-07-09 LAB — TYPE AND SCREEN
ABO/RH(D): A POS
Antibody Screen: NEGATIVE

## 2021-07-09 LAB — PREGNANCY, URINE: Preg Test, Ur: NEGATIVE

## 2021-07-09 SURGERY — XI ROBOTIC ASSISTED LAPAROSCOPIC HYSTERECTOMY AND SALPINGECTOMY
Anesthesia: General | Site: Abdomen | Laterality: Bilateral

## 2021-07-09 MED ORDER — AMISULPRIDE (ANTIEMETIC) 5 MG/2ML IV SOLN: 10.0000 mg | Freq: Once | INTRAVENOUS | Status: DC | PRN

## 2021-07-09 MED ORDER — MIDAZOLAM HCL 2 MG/2ML IJ SOLN
INTRAMUSCULAR | Status: AC
  Administered 2021-07-09: 2 mg via INTRAVENOUS
  Filled 2021-07-09: qty 2

## 2021-07-09 MED ORDER — FENTANYL CITRATE (PF) 100 MCG/2ML IJ SOLN
INTRAMUSCULAR | Status: AC
  Filled 2021-07-09: qty 2

## 2021-07-09 MED ORDER — CHLORHEXIDINE GLUCONATE CLOTH 2 % EX PADS
6.0000 | MEDICATED_PAD | Freq: Every day | CUTANEOUS | Status: DC
  Administered 2021-07-09 – 2021-07-10 (×2): 6 via TOPICAL

## 2021-07-09 MED ORDER — ENOXAPARIN SODIUM 40 MG/0.4ML IJ SOSY: 40.0000 mg | PREFILLED_SYRINGE | INTRAMUSCULAR | Status: DC

## 2021-07-09 MED ORDER — BUPIVACAINE HCL 0.25 % IJ SOLN
INTRAMUSCULAR | Status: DC | PRN
  Administered 2021-07-09: 30 mL

## 2021-07-09 MED ORDER — FENTANYL CITRATE PF 50 MCG/ML IJ SOSY
50.0000 ug | PREFILLED_SYRINGE | INTRAMUSCULAR | Status: DC
  Filled 2021-07-09: qty 1

## 2021-07-09 MED ORDER — TRAMADOL HCL 50 MG PO TABS
50.0000 mg | ORAL_TABLET | Freq: Four times a day (QID) | ORAL | Status: DC | PRN
  Administered 2021-07-09 – 2021-07-10 (×3): 50 mg via ORAL
  Filled 2021-07-09 (×3): qty 1

## 2021-07-09 MED ORDER — FENTANYL CITRATE PF 50 MCG/ML IJ SOSY
PREFILLED_SYRINGE | INTRAMUSCULAR | Status: AC
  Administered 2021-07-09: 50 ug via INTRAVENOUS
  Filled 2021-07-09: qty 2

## 2021-07-09 MED ORDER — CEFAZOLIN SODIUM-DEXTROSE 2-4 GM/100ML-% IV SOLN
INTRAVENOUS | Status: AC
  Filled 2021-07-09: qty 100

## 2021-07-09 MED ORDER — FENTANYL CITRATE PF 50 MCG/ML IJ SOSY
25.0000 ug | PREFILLED_SYRINGE | INTRAMUSCULAR | Status: DC | PRN
  Administered 2021-07-09: 25 ug via INTRAVENOUS

## 2021-07-09 MED ORDER — FENTANYL CITRATE (PF) 250 MCG/5ML IJ SOLN
INTRAMUSCULAR | Status: AC
  Filled 2021-07-09: qty 5

## 2021-07-09 MED ORDER — ALBUMIN HUMAN 5 % IV SOLN: INTRAVENOUS | Status: DC | PRN

## 2021-07-09 MED ORDER — ONDANSETRON HCL 4 MG PO TABS: 4.0000 mg | ORAL_TABLET | Freq: Four times a day (QID) | ORAL | Status: DC | PRN

## 2021-07-09 MED ORDER — MIDAZOLAM HCL 2 MG/2ML IJ SOLN: 1.0000 mg | INTRAMUSCULAR | Status: DC

## 2021-07-09 MED ORDER — BUPIVACAINE LIPOSOME 1.3 % IJ SUSP
INTRAMUSCULAR | Status: DC | PRN
  Administered 2021-07-09 (×2): 10 mL

## 2021-07-09 MED ORDER — BUPIVACAINE HCL 0.25 % IJ SOLN
INTRAMUSCULAR | Status: AC
  Filled 2021-07-09: qty 1

## 2021-07-09 MED ORDER — FENTANYL CITRATE PF 50 MCG/ML IJ SOSY: 50.0000 ug | PREFILLED_SYRINGE | INTRAMUSCULAR | Status: DC

## 2021-07-09 MED ORDER — ONDANSETRON HCL 4 MG/2ML IJ SOLN: 4.0000 mg | Freq: Four times a day (QID) | INTRAMUSCULAR | Status: DC | PRN

## 2021-07-09 MED ORDER — PROPOFOL 10 MG/ML IV BOLUS
INTRAVENOUS | Status: DC | PRN
  Administered 2021-07-09: 20 mg via INTRAVENOUS
  Administered 2021-07-09: 150 mg via INTRAVENOUS

## 2021-07-09 MED ORDER — ROCURONIUM BROMIDE 100 MG/10ML IV SOLN
INTRAVENOUS | Status: DC | PRN
  Administered 2021-07-09 (×4): 10 mg via INTRAVENOUS
  Administered 2021-07-09: 70 mg via INTRAVENOUS

## 2021-07-09 MED ORDER — SUGAMMADEX SODIUM 200 MG/2ML IV SOLN
INTRAVENOUS | Status: DC | PRN
  Administered 2021-07-09: 300 mg via INTRAVENOUS

## 2021-07-09 MED ORDER — PROPOFOL 10 MG/ML IV BOLUS
INTRAVENOUS | Status: AC
  Filled 2021-07-09: qty 20

## 2021-07-09 MED ORDER — BUPIVACAINE HCL (PF) 0.25 % IJ SOLN
INTRAMUSCULAR | Status: DC | PRN
  Administered 2021-07-09 (×2): 15 mL

## 2021-07-09 MED ORDER — LACTATED RINGERS IV SOLN: INTRAVENOUS | Status: DC | PRN

## 2021-07-09 MED ORDER — ALBUMIN HUMAN 5 % IV SOLN
INTRAVENOUS | Status: AC
  Filled 2021-07-09: qty 250

## 2021-07-09 MED ORDER — FENTANYL CITRATE (PF) 100 MCG/2ML IJ SOLN
INTRAMUSCULAR | Status: DC | PRN
  Administered 2021-07-09 (×4): 50 ug via INTRAVENOUS

## 2021-07-09 MED ORDER — ROCURONIUM BROMIDE 10 MG/ML (PF) SYRINGE
PREFILLED_SYRINGE | INTRAVENOUS | Status: AC
  Filled 2021-07-09: qty 10

## 2021-07-09 MED ORDER — ONDANSETRON HCL 4 MG/2ML IJ SOLN
INTRAMUSCULAR | Status: DC | PRN
  Administered 2021-07-09: 4 mg via INTRAVENOUS

## 2021-07-09 MED ORDER — KETAMINE HCL-SODIUM CHLORIDE 100-0.9 MG/10ML-% IV SOSY
PREFILLED_SYRINGE | INTRAVENOUS | Status: AC
  Filled 2021-07-09: qty 10

## 2021-07-09 MED ORDER — FENTANYL CITRATE (PF) 250 MCG/5ML IJ SOLN
INTRAMUSCULAR | Status: DC | PRN
  Administered 2021-07-09 (×3): 50 ug via INTRAVENOUS

## 2021-07-09 MED ORDER — PROMETHAZINE HCL 25 MG/ML IJ SOLN: 6.2500 mg | INTRAMUSCULAR | Status: DC | PRN

## 2021-07-09 MED ORDER — KCL IN DEXTROSE-NACL 20-5-0.45 MEQ/L-%-% IV SOLN
INTRAVENOUS | Status: DC
  Filled 2021-07-09 (×2): qty 1000

## 2021-07-09 MED ORDER — BUPIVACAINE LIPOSOME 1.3 % IJ SUSP
INTRAMUSCULAR | Status: AC
  Filled 2021-07-09: qty 20

## 2021-07-09 MED ORDER — IBUPROFEN 200 MG PO TABS
600.0000 mg | ORAL_TABLET | Freq: Four times a day (QID) | ORAL | Status: DC
  Administered 2021-07-09 – 2021-07-10 (×3): 600 mg via ORAL
  Filled 2021-07-09 (×3): qty 3

## 2021-07-09 MED ORDER — MIDAZOLAM HCL 2 MG/2ML IJ SOLN
INTRAMUSCULAR | Status: AC
  Filled 2021-07-09: qty 2

## 2021-07-09 MED ORDER — SIMETHICONE 80 MG PO CHEW: 80.0000 mg | CHEWABLE_TABLET | Freq: Four times a day (QID) | ORAL | Status: DC | PRN

## 2021-07-09 MED ORDER — KETAMINE HCL 10 MG/ML IJ SOLN
INTRAMUSCULAR | Status: DC | PRN
  Administered 2021-07-09: 20 mg via INTRAVENOUS
  Administered 2021-07-09: 5 mg via INTRAVENOUS

## 2021-07-09 MED ORDER — DEXAMETHASONE SODIUM PHOSPHATE 10 MG/ML IJ SOLN
INTRAMUSCULAR | Status: DC | PRN
  Administered 2021-07-09: 10 mg via INTRAVENOUS

## 2021-07-09 MED ORDER — LIDOCAINE HCL (CARDIAC) PF 100 MG/5ML IV SOSY
PREFILLED_SYRINGE | INTRAVENOUS | Status: DC | PRN
  Administered 2021-07-09: 40 mg via INTRAVENOUS

## 2021-07-09 MED ORDER — ROCURONIUM BROMIDE 10 MG/ML (PF) SYRINGE
PREFILLED_SYRINGE | INTRAVENOUS | Status: AC
  Filled 2021-07-09: qty 30

## 2021-07-09 MED ORDER — FENTANYL CITRATE PF 50 MCG/ML IJ SOSY
PREFILLED_SYRINGE | INTRAMUSCULAR | Status: AC
  Administered 2021-07-09: 100 ug via INTRAVENOUS
  Filled 2021-07-09: qty 2

## 2021-07-09 MED ORDER — OXYCODONE HCL 5 MG PO TABS: 5.0000 mg | ORAL_TABLET | ORAL | Status: DC | PRN

## 2021-07-09 MED ORDER — SENNOSIDES-DOCUSATE SODIUM 8.6-50 MG PO TABS: 1.0000 | ORAL_TABLET | Freq: Every evening | ORAL | Status: DC | PRN

## 2021-07-09 MED ORDER — LACTATED RINGERS IR SOLN
Status: DC | PRN
  Administered 2021-07-09: 1000 mL

## 2021-07-09 SURGICAL SUPPLY — 73 items
APPLICATOR ARISTA FLEXITIP XL (MISCELLANEOUS) ×2 IMPLANT
APPLICATOR SURGIFLO ENDO (HEMOSTASIS) IMPLANT
BAG COUNTER SPONGE SURGICOUNT (BAG) IMPLANT
BAG LAPAROSCOPIC 12 15 PORT 16 (BASKET) ×2 IMPLANT
BAG RETRIEVAL 12/15 (BASKET) ×6
BLADE SURG SZ10 CARB STEEL (BLADE) ×2 IMPLANT
COVER BACK TABLE 60X90IN (DRAPES) ×3 IMPLANT
COVER TIP SHEARS 8 DVNC (MISCELLANEOUS) ×2 IMPLANT
COVER TIP SHEARS 8MM DA VINCI (MISCELLANEOUS) ×1
DECANTER SPIKE VIAL GLASS SM (MISCELLANEOUS) ×3 IMPLANT
DERMABOND ADVANCED (GAUZE/BANDAGES/DRESSINGS) ×1
DERMABOND ADVANCED .7 DNX12 (GAUZE/BANDAGES/DRESSINGS) ×2 IMPLANT
DRAPE ARM DVNC X/XI (DISPOSABLE) ×8 IMPLANT
DRAPE COLUMN DVNC XI (DISPOSABLE) ×2 IMPLANT
DRAPE DA VINCI XI ARM (DISPOSABLE) ×4
DRAPE DA VINCI XI COLUMN (DISPOSABLE) ×1
DRAPE SHEET LG 3/4 BI-LAMINATE (DRAPES) ×3 IMPLANT
DRAPE SURG IRRIG POUCH 19X23 (DRAPES) ×3 IMPLANT
DRSG OPSITE POSTOP 4X6 (GAUZE/BANDAGES/DRESSINGS) ×2 IMPLANT
DRSG OPSITE POSTOP 4X8 (GAUZE/BANDAGES/DRESSINGS) IMPLANT
ELECT PENCIL ROCKER SW 15FT (MISCELLANEOUS) ×2 IMPLANT
ELECT REM PT RETURN 15FT ADLT (MISCELLANEOUS) ×3 IMPLANT
GAUZE 4X4 16PLY ~~LOC~~+RFID DBL (SPONGE) ×3 IMPLANT
GLOVE SURG ENC MOIS LTX SZ6 (GLOVE) ×14 IMPLANT
GLOVE SURG ENC MOIS LTX SZ6.5 (GLOVE) ×6 IMPLANT
GOWN STRL REUS W/ TWL LRG LVL3 (GOWN DISPOSABLE) ×9 IMPLANT
GOWN STRL REUS W/TWL LRG LVL3 (GOWN DISPOSABLE) ×5
HEMOSTAT ARISTA ABSORB 3G PWDR (HEMOSTASIS) ×2 IMPLANT
HOLDER FOLEY CATH W/STRAP (MISCELLANEOUS) IMPLANT
IRRIG SUCT STRYKERFLOW 2 WTIP (MISCELLANEOUS) ×3
IRRIGATION SUCT STRKRFLW 2 WTP (MISCELLANEOUS) ×2 IMPLANT
KIT TURNOVER KIT A (KITS) IMPLANT
MANIPULATOR ADVINCU DEL 3.0 PL (MISCELLANEOUS) IMPLANT
MANIPULATOR ADVINCU DEL 3.5 PL (MISCELLANEOUS) ×2 IMPLANT
MANIPULATOR UTERINE 4.5 ZUMI (MISCELLANEOUS) IMPLANT
NDL HYPO 21X1.5 SAFETY (NEEDLE) ×1 IMPLANT
NEEDLE HYPO 21X1.5 SAFETY (NEEDLE) ×3 IMPLANT
OBTURATOR OPTICAL STANDARD 8MM (TROCAR) ×1
OBTURATOR OPTICAL STND 8 DVNC (TROCAR) ×2
OBTURATOR OPTICALSTD 8 DVNC (TROCAR) ×2 IMPLANT
PACK ROBOT GYN CUSTOM WL (TRAY / TRAY PROCEDURE) ×3 IMPLANT
PAD POSITIONING PINK XL (MISCELLANEOUS) ×3 IMPLANT
PIPET BIOPSY ENDOMETRIAL 3MM (SUCTIONS) ×2 IMPLANT
PORT ACCESS TROCAR AIRSEAL 12 (TROCAR) ×2 IMPLANT
PORT ACCESS TROCAR AIRSEAL 5M (TROCAR) ×1
POUCH SPECIMEN RETRIEVAL 10MM (ENDOMECHANICALS) IMPLANT
RTRCTR WOUND ALEXIS 18CM SML (INSTRUMENTS) ×6
SAVER CELL AAL HAEMONETICS (INSTRUMENTS) ×2 IMPLANT
SCRUB EXIDINE 4% CHG 4OZ (MISCELLANEOUS) ×3 IMPLANT
SEAL CANN UNIV 5-8 DVNC XI (MISCELLANEOUS) ×8 IMPLANT
SEAL XI 5MM-8MM UNIVERSAL (MISCELLANEOUS) ×4
SEALER VESSEL DA VINCI XI (MISCELLANEOUS) ×1
SEALER VESSEL EXT DVNC XI (MISCELLANEOUS) ×1 IMPLANT
SET TRI-LUMEN FLTR TB AIRSEAL (TUBING) ×3 IMPLANT
SOL ANTI FOG 6CC (MISCELLANEOUS) ×1 IMPLANT
SOLUTION ANTI FOG 6CC (MISCELLANEOUS) ×1
SPONGE T-LAP 18X18 ~~LOC~~+RFID (SPONGE) ×2 IMPLANT
SURGIFLO W/THROMBIN 8M KIT (HEMOSTASIS) IMPLANT
SUT MNCRL AB 4-0 PS2 18 (SUTURE) ×2 IMPLANT
SUT PDS AB 1 TP1 96 (SUTURE) ×4 IMPLANT
SUT VIC AB 0 CT1 27 (SUTURE)
SUT VIC AB 0 CT1 27XBRD ANTBC (SUTURE) IMPLANT
SUT VIC AB 2-0 CT1 27 (SUTURE) ×1
SUT VIC AB 2-0 CT1 TAPERPNT 27 (SUTURE) ×1 IMPLANT
SUT VIC AB 4-0 PS2 18 (SUTURE) ×8 IMPLANT
SYR 10ML LL (SYRINGE) IMPLANT
TOWEL OR NON WOVEN STRL DISP B (DISPOSABLE) IMPLANT
TRAP SPECIMEN MUCUS 40CC (MISCELLANEOUS) IMPLANT
TRAY FOLEY MTR SLVR 16FR STAT (SET/KITS/TRAYS/PACK) ×3 IMPLANT
TROCAR XCEL NON-BLD 5MMX100MML (ENDOMECHANICALS) IMPLANT
UNDERPAD 30X36 HEAVY ABSORB (UNDERPADS AND DIAPERS) ×6 IMPLANT
WATER STERILE IRR 1000ML POUR (IV SOLUTION) ×3 IMPLANT
YANKAUER SUCT BULB TIP 10FT TU (MISCELLANEOUS) ×2 IMPLANT

## 2021-07-09 NOTE — Progress Notes (Signed)
GYN ONC Progress Note  Betty Gutierrez is a 50 year old female currently admitted with uterine leiomyomas and endometrial hyperplasia for surgery with Dr. Berline Lopes later today. She is s/p uterine artery embolization with IR on 07/08/2021.   Subjective: Patient reports doing well this am. States the fentanyl PCA was not offering adequate pain relief but the addition of Ketoralac helped significantly. Abdomen feels less firm and distended since embolization. No nausea or emesis reported. Denies chest pain, dyspnea. No concerns voiced.    Objective: Vital signs in last 24 hours: Temp:  [97.8 F (36.6 C)-98.4 F (36.9 C)] 98.2 F (36.8 C) (12/15 1011) Pulse Rate:  [59-84] 76 (12/15 1011) Resp:  [9-22] 22 (12/15 1011) BP: (118-150)/(78-101) 136/78 (12/15 1011) SpO2:  [97 %-100 %] 100 % (12/15 1011) FiO2 (%):  [0 %] 0 % (12/14 1527) Weight:  [192 lb 3.9 oz (87.2 kg)] 192 lb 3.9 oz (87.2 kg) (12/14 1524)    Intake/Output from previous day: 12/14 0701 - 12/15 0700 In: 288.1 [P.O.:120; I.V.:168.1] Out: 1150 [Urine:1150]  Physical Examination: General: alert, cooperative, and no distress Resp: clear to auscultation bilaterally Cardio: regular rate and rhythm, S1, S2 normal, no murmur, click, rub or gallop GI: soft, non-tender; bowel sounds normal; no masses,  no organomegaly Extremities: extremities normal, atraumatic, no cyanosis or edema Foley in place with clear, yellow urine. Embolization dressing dry and intact.  Labs: Type and Screen this am  Assessment: 50 y.o. admitted with uterine leiomyomas and endometrial hyperplasia for surgery with Dr. Berline Lopes later today. She is s/p uterine artery embolization with IR on 07/08/2021. Pain:  Pain is well-controlled on PCA and Ketoralac.  Heme: Last CBC on 07/07/2021 with Hgb 12.0 and Hct 38.9 s/p iron infusions.  CV: BP and HR stable. Continue to monitor with ordered vital sign assessments.  GI:  Tolerating po: NPO for surgery later  today.  FEN: No critical values on Bmet from 07/08/2021.  Prophylaxis: SCDs in place. To receive heparin dose pre-operatively.  Plan: Continue with planned surgery later today Remain NPO Stop all NSAIDS prior to surgery Plan for overnight stay in the hospital after surgery with possible discharge tomorrow if meeting discharge criteria   LOS: 0 days    Dorothyann Gibbs 07/09/2021, 10:51 AM

## 2021-07-09 NOTE — Anesthesia Procedure Notes (Signed)
Anesthesia Regional Block: TAP block   Pre-Anesthetic Checklist: , timeout performed,  Correct Patient, Correct Site, Correct Laterality,  Correct Procedure, Correct Position, site marked,  Risks and benefits discussed,  Surgical consent,  Pre-op evaluation,  At surgeon's request and post-op pain management  Laterality: Left  Prep: chloraprep       Needles:  Injection technique: Single-shot  Needle Type: Echogenic Stimulator Needle     Needle Length: 10cm  Needle Gauge: 21     Additional Needles:   Procedures:,,,, ultrasound used (permanent image in chart),,    Narrative:  Start time: 07/09/2021 12:47 PM End time: 07/09/2021 12:52 PM Injection made incrementally with aspirations every 5 mL.  Performed by: Personally

## 2021-07-09 NOTE — Anesthesia Procedure Notes (Addendum)
Anesthesia Regional Block: TAP block   Pre-Anesthetic Checklist: , timeout performed,  Correct Patient, Correct Site, Correct Laterality,  Correct Procedure, Correct Position, site marked,  Risks and benefits discussed,  Surgical consent,  Pre-op evaluation,  At surgeon's request and post-op pain management  Laterality: Right  Prep: chloraprep       Needles:  Injection technique: Single-shot  Needle Type: Echogenic Stimulator Needle     Needle Length: 10cm  Needle Gauge: 21     Additional Needles:   Procedures:,,,, ultrasound used (permanent image in chart),,    Narrative:  Start time: 07/09/2021 12:57 PM End time: 07/09/2021 1:04 PM Injection made incrementally with aspirations every 5 mL.  Performed by: Personally

## 2021-07-09 NOTE — Anesthesia Procedure Notes (Signed)
Procedure Name: Intubation Date/Time: 07/09/2021 1:56 PM Performed by: Niel Hummer, CRNA Pre-anesthesia Checklist: Patient identified, Emergency Drugs available, Suction available and Patient being monitored Patient Re-evaluated:Patient Re-evaluated prior to induction Oxygen Delivery Method: Circle System Utilized Preoxygenation: Pre-oxygenation with 100% oxygen Induction Type: IV induction Ventilation: Mask ventilation without difficulty Laryngoscope Size: Mac and 4 Grade View: Grade I Tube type: Oral Number of attempts: 1 Airway Equipment and Method: Stylet Placement Confirmation: ETT inserted through vocal cords under direct vision, positive ETCO2 and breath sounds checked- equal and bilateral Secured at: 21 cm Tube secured with: Tape Dental Injury: Teeth and Oropharynx as per pre-operative assessment

## 2021-07-09 NOTE — H&P (Signed)
Gynecologic Oncology H&P  07/09/21  Interval History: Had [place] yesterday, overall doing well. Pain controlled. Foley catheter in place. Ready to move forward with surgery.  Past Medical/Surgical History: Past Medical History:  Diagnosis Date   Anemia 06/30/2021   last iron transfusion   Family history of pulmonary embolism 07/31/2017   Multiple thyroid nodules     Past Surgical History:  Procedure Laterality Date   Cesarian  N/A 2004   IR ANGIOGRAM PELVIS SELECTIVE OR SUPRASELECTIVE  07/08/2021   IR ANGIOGRAM PELVIS SELECTIVE OR SUPRASELECTIVE  07/08/2021   IR ANGIOGRAM SELECTIVE EACH ADDITIONAL VESSEL  07/08/2021   IR ANGIOGRAM SELECTIVE EACH ADDITIONAL VESSEL  07/08/2021   IR EMBO TUMOR ORGAN ISCHEMIA INFARCT INC GUIDE ROADMAPPING  07/08/2021   IR RADIOLOGIST EVAL & MGMT  06/11/2021   IR US GUIDE VASC ACCESS RIGHT  07/08/2021   MYOMECTOMY N/A 1999   uterine fibroid emoblization  N/A 2014    Family History  Problem Relation Age of Onset   Pulmonary embolism Mother        following international flight   Breast cancer Mother    Prostate cancer Father    Pulmonary embolism Sister    Pulmonary embolism Sister    Pulmonary embolism Maternal Aunt        Twin of the patient's mother, also occurred following and international flight   Colon cancer Neg Hx    Ovarian cancer Neg Hx    Endometrial cancer Neg Hx    Pancreatic cancer Neg Hx     Social History   Socioeconomic History   Marital status: Divorced    Spouse name: Not on file   Number of children: Not on file   Years of education: Not on file   Highest education level: Not on file  Occupational History   Not on file  Tobacco Use   Smoking status: Never   Smokeless tobacco: Never  Vaping Use   Vaping Use: Never used  Substance and Sexual Activity   Alcohol use: Yes    Alcohol/week: 1.0 standard drink    Types: 1 Glasses of wine per week   Drug use: No   Sexual activity: Not Currently  Other Topics  Concern   Not on file  Social History Narrative   Not on file   Social Determinants of Health   Financial Resource Strain: Not on file  Food Insecurity: Not on file  Transportation Needs: Not on file  Physical Activity: Not on file  Stress: Not on file  Social Connections: Not on file    Current Medications:  Current Facility-Administered Medications:    0.9 %  sodium chloride infusion, 250 mL, Intravenous, PRN, Sandi Mariscal, MD   ceFAZolin (ANCEF) IVPB 2g/100 mL premix, 2 g, Intravenous, On Call to OR, Joylene John D, NP, Held at 07/09/21 1124   Chlorhexidine Gluconate Cloth 2 % PADS 6 each, 6 each, Topical, Daily, Cross, Melissa D, NP, 6 each at 07/09/21 1121   dexamethasone (DECADRON) injection 4 mg, 4 mg, Intravenous, On Call to OR, Cross, Melissa D, NP   diphenhydrAMINE (BENADRYL) injection 12.5 mg, 12.5 mg, Intravenous, Q6H PRN **OR** diphenhydrAMINE (BENADRYL) 12.5 MG/5ML elixir 12.5 mg, 12.5 mg, Oral, Q6H PRN, Sandi Mariscal, MD   fentaNYL 50 mcg/mL PCA injection, , Intravenous, Q4H, Sandi Mariscal, MD, Syringe Replaced at 07/09/21 0032   heparin injection 5,000 Units, 5,000 Units, Subcutaneous, On Call to OR, Sandi Mariscal, MD   iron polysaccharides (NIFEREX) capsule 150 mg, 150 mg, Oral,  Once per day on Mon Thu, Watts, John, MD   ketorolac (TORADOL) 15 MG/ML injection 15 mg, 15 mg, Intravenous, On Call to OR, Cross, Melissa D, NP   naloxone (NARCAN) injection 0.4 mg, 0.4 mg, Intravenous, PRN **AND** sodium chloride flush (NS) 0.9 % injection 9 mL, 9 mL, Intravenous, PRN, Sandi Mariscal, MD   ondansetron [facility]) injection 4 mg, 4 mg, Intravenous, Q6H PRN, Sandi Mariscal, MD, 4 mg at 07/08/21 1857   scopolamine (TRANSDERM-SCOP) 1 MG/3DAYS 1.5 mg, 1 patch, Transdermal, On Call to OR, Cross, Melissa D, NP   senna-docusate (Senokot-S) tablet 2 tablet, 2 tablet, Oral, QHS, Sandi Mariscal, MD   sodium chloride flush (NS) 0.9 % injection 3 mL, 3 mL, Intravenous, Q12H, Sandi Mariscal, MD   sodium  chloride flush (NS) 0.9 % injection 3 mL, 3 mL, Intravenous, PRN, Sandi Mariscal, MD  Physical Exam: BP 136/78 (BP Location: Left Arm)    Pulse 76    Temp 98.2 F (36.8 C) (Oral)    Resp 18    Ht 5\' 4"  (1.626 m)    Wt 192 lb 3.9 oz (87.2 kg)    SpO2 100%    BMI 33.00 kg/m  General: Alert, oriented, no acute distress. HEENT: Normocephalic, atraumatic, sclera anicteric. Chest: Clear to auscultation bilaterally.  No wheezes or rhonchi. Cardiovascular: Regular rate and rhythm, no murmurs. Abdomen: soft, nontender.  Normoactive bowel sounds.  Fibroid uterus appreciated mid-way between umbilicus and xyphoid, much mor mobility laterally. Extremities: Grossly normal range of motion.  Warm, well perfused.  No edema bilaterally.  Laboratory & Radiologic Studies: CBC    Component Value Date/Time   WBC 4.8 07/07/2021 1516   WBC 3.4 (L) 07/03/2021 0923   RBC 4.95 07/07/2021 1516   HGB 12.0 07/07/2021 1516   HCT 38.9 07/07/2021 1516   PLT 296 07/07/2021 1516   MCV 78.6 (L) 07/07/2021 1516   MCH 24.2 (L) 07/07/2021 1516   MCHC 30.8 07/07/2021 1516   RDW 29.0 (H) 07/07/2021 1516   LYMPHSABS 1.8 07/07/2021 1516   MONOABS 0.2 07/07/2021 1516   EOSABS 0.0 07/07/2021 1516   BASOSABS 0.0 07/07/2021 1516   BMP Latest Ref Rng & Units 07/08/2021 07/03/2021 05/21/2021  Glucose 70 - 99 mg/dL 90 85 81  BUN 6 - 20 mg/dL 11 11 9   Creatinine 0.44 - 1.00 mg/dL 0.58 0.69 0.77  Sodium 135 - 145 mmol/L 137 136 139  Potassium 3.5 - 5.1 mmol/L 3.4(L) 3.8 3.7  Chloride 98 - 111 mmol/L 107 106 106  CO2 22 - 32 mmol/L 21(L) 22 23  Calcium 8.9 - 10.3 mg/dL 8.9 8.6(L) 9.0   Assessment & Plan: Markia Kyer is a 50 y.o. woman with large fibroid uterus s/p [place] yesterday in plan for definitive hysterectomy/BS today. Will attempt robotically although will need lap for specimen delivery even if able to proceed with robotic hysterectomy.  Plan for TAP block, cell saver available.    Jeral Pinch, MD  Division of  Gynecologic Oncology  Department of Obstetrics and Gynecology  [facility]

## 2021-07-09 NOTE — Progress Notes (Signed)
Per Dr. Berline Lopes discontinue Fentanyl PCA orders and okay for patient to receive NSAIDs at this time.

## 2021-07-09 NOTE — Op Note (Signed)
OPERATIVE NOTE  Pre-operative Diagnosis: large fibroid uterus, AUB  Post-operative Diagnosis: same  Operation: Robotic-assisted laparoscopic total hysterectomy with bilateral salpingectomy, mini-lap for specimen delivery with contained morcellation, cystoscopy Uterus and fibroid estimated to be 2kg  Surgeon: Jeral Pinch MD  Assistant Surgeon: Lahoma Crocker MD (an MD assistant was necessary for tissue manipulation, management of robotic instrumentation, retraction and positioning due to the complexity of the case and hospital policies).   Anesthesia: GET  Urine Output: 500cc  Operative Findings: On EUA, large, moderately mobile uterus extending to the umbilicus with fibroid palpated in the mid and upper left abdomen. On intra-abdominal entry, normal upper abdominal survey. Normal omentum, small and large bowel. Enlarged 18cm fibroid uterus with 15cm calcified pedunculated fundal fibroid. Normal appearing fallopian tubes and ovaries. No ascites. On morcellation of both specimens, significant calcification was noted of the fibroids. On cystoscopy, bladder dome intact, good efflux noted from ureteral orifices.   Estimated Blood Loss:  200cc      Total IV Fluids: see I&O flowsheet         Specimens: uterus, cervix, bilateral tubes, uterine fibroid         Complications:  None apparent; patient tolerated the procedure well.         Disposition: PACU - hemodynamically stable.  Procedure Details  The patient was seen in the Holding Room. The risks, benefits, complications, treatment options, and expected outcomes were discussed with the patient.  The patient concurred with the proposed plan, giving informed consent.  The site of surgery properly noted/marked. The patient was identified as Betty Gutierrez and the procedure verified as a Robotic-assisted hysterectomy with bilateral salpingectomy, removal of fibroid, possible exploratory laparotomy.   After induction of anesthesia,  the patient was draped and prepped in the usual sterile manner. Patient was placed in supine position after anesthesia and draped and prepped in the usual sterile manner as follows: Her arms were tucked to her side with all appropriate precautions.  The shoulders were stabilized with padded shoulder blocks applied to the acromium processes.  The patient was placed in the semi-lithotomy position in [place].  The perineum and vagina were prepped with CholoraPrep. The patient was draped after the CholoraPrep had been allowed to dry for 3 minutes.  A Time Out was held and the above information confirmed.  The urethra was prepped with Betadine. Foley catheter was placed.  A sterile speculum was placed in the vagina.  The cervix was grasped with a single-tooth tenaculum. The cervix was dilated with Kennon Rounds dilators.  Endometrial biopsy was collected with uterus sounding to 11 cm. Two passes were performed with mostly blood noted. The 3.5 Delineator uterine manipulator with a colpotomizer ring was placed without difficulty.  A pneum occluder balloon was placed over the manipulator.  OG tube placement was confirmed and to suction.   Next, a 10 mm skin incision was made 1 cm below the subcostal margin in the midclavicular line.  The 5 mm Optiview port and scope was used for direct entry.  Opening pressure was under 10 mm CO2.  The abdomen was insufflated and the findings were noted as above.   At this point and all points during the procedure, the patient's intra-abdominal pressure did not exceed 15 mmHg. Next, an 8 mm skin incision was made superior to the umbilicus and a right and left port were placed about 8 cm lateral to the robot port on the right and left side.  A fourth arm was placed on  the right.  The 5 mm assist trocar was exchanged for a 10-12 mm port. All ports were placed under direct visualization.  The patient was placed in steep Trendelenburg.  The robot was docked in the normal manner.  The right  and left peritoneum were opened parallel to the IP ligament to open the retroperitoneal spaces bilaterally. The round ligaments were transected. The ureter was again noted to be on the medial leaf of the broad ligament.  The fallopian tube was elevated and electrocautery was used to cauterize and then transect just inferior to the fallopian tube, free it from the ovary. The utero-ovarian ligament was then skeletonized, cauterized and cut.    The creation of the bladder flap and skeletonization and cauterization of the uterine vessels was somewhat challenging given large uterus with minimal mobility. The anterior peritoneum was also taken down.  The bladder flap was created to the level of the KOH ring.  The uterine artery on the right side was skeletonized, cauterized and cut in the normal manner, with both bipolar electrocautery and the vessel sealer.  A similar procedure was performed on the left.  The posterior peritoneum was taken down as was possible to the level of the KOH ring.  The colpotomy was made anteriorly and laterally before the manipulator was removed to allow for facilitation of the posterior colpotomy. Ultimately, the uterus, cervix, bilateral tubes were amputated.  Pedicles were inspected and excellent hemostasis was achieved.    The colpotomy at the vaginal cuff was closed with Vicryl on a CT1 needle in running manner.  Irrigation was used and excellent hemostasis was achieved.  Monopolar electrocautery was used to detach the pedunculated fibroid from the uterus. Both the fibroid and the uterus were placed in 15 mm Endocatch bags inserted through the assist trocar. Robotic instruments were removed under direct visulaization.  The robot was undocked.   With the abdomen still insufflated, the supraumbilical trocar was removed and this incision extended to a length of 4-6 cm. The incision was carried to to the fascia and through it with monopolar electrocautery. The peritoneum incision was  opened bluntly. Each bag was then sequentially brought through the mini-lap incision and morcellation was performed. Bags were insured not to have been breached after final delivery of the specimens. The process of morcellating the fibroid and fibroid uterus took approximately 60 minutes.  Once delivered, the mini-lap incision was closed with two running stitches of #1 looped PDS tied in the midline. The subcutaneous tissue was irrigated and 2-0 Vicryl was used to reapproximate the tissue.   The fascia at the 10-12 mm port was closed with 0 Vicryl on a UR-5 needle.  The subcuticular tissue was closed with 4-0 Vicryl and the skin was closed with 4-0 Monocryl in a subcuticular manner.  Dermabond was applied.    Cystoscopy was performed after foley catheter was removed with findings noted above.   The vagina was swabbed with  minimal bleeding noted.   All sponge, lap and needle counts were correct x  3.   The patient was transferred to the recovery room in stable condition.  Jeral Pinch, MD

## 2021-07-09 NOTE — Progress Notes (Signed)
[place] [phone]   [phone]  PROGRESS NOTE  Patient Care Team: Servando Salina, MD as PCP - General (Obstetrics and Gynecology)  Hematological/Oncological History # Iron Deficiency Anemia 2/2 to GYN Bleeding 1) 05/13/2021: WBC 4.6, Hgb 8.2, MCV 68.6, Plt 319. Ferritin <4 and Iron sat 4% 2) 05/21/2021: establish care with Dr. Lorenso Courier  3) 06/02/2021-06/30/2021: IV venofer x 5 doses  #Family History of Venous Thromboembolism 07/01/2017: last seen by Dr. Lebron Conners at the [facility]. Recommend Xarelto 62m PO with travel due to history of VTEs with mother, maternal aunt, and 2 sisters.   CHIEF COMPLAINTS/PURPOSE OF CONSULTATION:  "Iron Deficiency Anemia "  HISTORY OF PRESENTING ILLNESS:  PCerys Winget50y.o. female returns for a follow up for iron deficiency anemia. She was last seen on 05/21/2021. In the interim, she received 5 doses of IV venofer completed on 06/30/2021. She is unaccompanied for this visit.   On exam today Mrs. Slinker reports that her energy levels have improved with IV iron. She continues to complete her daily activities on her own. She tolerated IV iron without any side effects. She has a good appetite without any noticeable weight loss. She denies nausea, vomiting or abdominal pain. Her menstrual bleeding continues to be heavy but she is scheduled to undergo laparoscopic hysterectomy later this week. She denies any fevers, chills, night sweats, shortness of breath, chest pain, diarrhea, constipation. A full 10 point ROS is listed below.  MEDICAL HISTORY:  Past Medical History:  Diagnosis Date   Anemia 06/30/2021   last iron transfusion   Family history of pulmonary embolism 07/31/2017   Multiple thyroid nodules     SURGICAL HISTORY: Past Surgical History:  Procedure Laterality Date   Cesarian  N/A 2004   IR ANGIOGRAM PELVIS SELECTIVE OR SUPRASELECTIVE  07/08/2021   IR ANGIOGRAM PELVIS SELECTIVE OR SUPRASELECTIVE   07/08/2021   IR ANGIOGRAM SELECTIVE EACH ADDITIONAL VESSEL  07/08/2021   IR ANGIOGRAM SELECTIVE EACH ADDITIONAL VESSEL  07/08/2021   IR EMBO TUMOR ORGAN ISCHEMIA INFARCT INC GUIDE ROADMAPPING  07/08/2021   IR RADIOLOGIST EVAL & MGMT  06/11/2021   IR [place] VASC ACCESS RIGHT  07/08/2021   MYOMECTOMY N/A 1999   uterine fibroid emoblization  N/A 2014    SOCIAL HISTORY: Social History   Socioeconomic History   Marital status: Divorced    Spouse name: Not on file   Number of children: Not on file   Years of education: Not on file   Highest education level: Not on file  Occupational History   Not on file  Tobacco Use   Smoking status: Never   Smokeless tobacco: Never  Vaping Use   Vaping Use: Never used  Substance and Sexual Activity   Alcohol use: Yes    Alcohol/week: 1.0 standard drink    Types: 1 Glasses of wine per week   Drug use: No   Sexual activity: Not Currently  Other Topics Concern   Not on file  Social History Narrative   Not on file   Social Determinants of Health   Financial Resource Strain: Not on file  Food Insecurity: Not on file  Transportation Needs: Not on file  Physical Activity: Not on file  Stress: Not on file  Social Connections: Not on file  Intimate Partner Violence: Not on file    FAMILY HISTORY: Family History  Problem Relation Age of Onset   Pulmonary embolism Mother        following international flight  Breast cancer Mother    Prostate cancer Father    Pulmonary embolism Sister    Pulmonary embolism Sister    Pulmonary embolism Maternal Aunt        Twin of the patient's mother, also occurred following and international flight   Colon cancer Neg Hx    Ovarian cancer Neg Hx    Endometrial cancer Neg Hx    Pancreatic cancer Neg Hx     ALLERGIES:  is allergic to hydromorphone.  MEDICATIONS:  No current facility-administered medications for this visit.   No current outpatient medications on file.   Facility-Administered  Medications Ordered in Other Visits  Medication Dose Route Frequency Provider Last Rate Last Admin   [MAR Hold] 0.9 %  sodium chloride infusion  250 mL Intravenous PRN Sandi Mariscal, MD       albumin human 5 % solution   Intravenous Continuous PRN Niel Hummer, CRNA   Stopped at 07/09/21 1455   bupivacaine (MARCAINE) 0.25 % (with pres) injection    PRN Lafonda Mosses, MD   30 mL at 07/09/21 1443   bupivacaine (PF) (MARCAINE) 0.25 % injection   Infiltration Anesthesia Intra-op Duane Boston, MD   15 mL at 07/09/21 1302   bupivacaine liposome (EXPAREL) 1.3 % injection   Infiltration Anesthesia Intra-op Duane Boston, MD   10 mL at 07/09/21 1302   [MAR Hold] Chlorhexidine Gluconate Cloth 2 % PADS 6 each  6 each Topical Daily Cross, Melissa D, NP   6 each at 07/09/21 1121   dexamethasone (DECADRON) injection 4 mg  4 mg Intravenous On Call to [place], Melissa D, NP       dexamethasone (DECADRON) injection   Intravenous Anesthesia Intra-op Niel Hummer, CRNA   10 mg at 07/09/21 1411   [MAR Hold] diphenhydrAMINE (BENADRYL) injection 12.5 mg  12.5 mg Intravenous Q6H PRN Sandi Mariscal, MD       Or   [facility] Hold] diphenhydrAMINE (BENADRYL) 12.5 MG/5ML elixir 12.5 mg  12.5 mg Oral Q6H PRN Sandi Mariscal, MD       fentaNYL (SUBLIMAZE) injection 50-100 mcg  50-100 mcg Intravenous Sherri Rad, MD       fentaNYL (SUBLIMAZE) injection 50-100 mcg  50-100 mcg Intravenous Sherri Rad, MD   100 mcg at 07/09/21 1243   fentaNYL (SUBLIMAZE) injection   Intravenous Anesthesia Intra-op Niel Hummer, CRNA   50 mcg at 07/09/21 1449   [MAR Hold] fentaNYL 50 mcg/mL PCA injection   Intravenous Acey Lav, MD   Syringe Replaced at 07/09/21 0032   [MAR Hold] iron polysaccharides (NIFEREX) capsule 150 mg  150 mg Oral Once per day on Mon Thu Watts, John, MD       ketamine (KETALAR) injection   Intravenous Anesthesia Intra-op Niel Hummer, CRNA   5 mg at 07/09/21 1414   ketorolac  (TORADOL) 15 MG/ML injection 15 mg  15 mg Intravenous On Call to [place], Melissa D, NP       lactated ringers infusion   Intravenous Continuous PRN Niel Hummer, CRNA   New Bag at 07/09/21 1348   lactated ringers irrigation solution    PRN Lafonda Mosses, MD   1,000 mL at 07/09/21 1444   lidocaine (cardiac) 100 mg/62m (XYLOCAINE) injection 2%   Intravenous Anesthesia Intra-op PNiel Hummer CRNA   40 mg at 07/09/21 1354   midazolam (VERSED) 2 MG/2ML injection            midazolam (  VERSED) injection 1-2 mg  1-2 mg Intravenous Sherri Rad, MD   2 mg at 07/09/21 1252   midazolam (VERSED) injection 1-2 mg  1-2 mg Intravenous Sherri Rad, MD   2 mg at 07/09/21 1243   midazolam (VERSED) injection 1-2 mg  1-2 mg Intravenous Sherri Rad, MD       [facility] Hold] naloxone [facility]) injection 0.4 mg  0.4 mg Intravenous PRN Sandi Mariscal, MD       And   [facility] Hold] sodium chloride flush (NS) 0.9 % injection 9 mL  9 mL Intravenous PRN Sandi Mariscal, MD       [facility] Hold] ondansetron [facility]) injection 4 mg  4 mg Intravenous Q6H PRN Sandi Mariscal, MD   4 mg at 07/08/21 1857   propofol (DIPRIVAN) 10 mg/mL bolus/IV push   Intravenous Anesthesia Intra-op Niel Hummer, CRNA   20 mg at 07/09/21 1355   rocuronium (ZEMURON) injection   Intravenous Anesthesia Intra-op Niel Hummer, CRNA   10 mg at 07/09/21 1510   scopolamine (TRANSDERM-SCOP) 1 MG/3DAYS 1.5 mg  1 patch Transdermal On Call to OR Joylene John D, NP   1.5 mg at 07/09/21 1235   [MAR Hold] senna-docusate (Senokot-S) tablet 2 tablet  2 tablet Oral QHS Sandi Mariscal, MD       [facility] Hold] sodium chloride flush (NS) 0.9 % injection 3 mL  3 mL Intravenous Q12H Sandi Mariscal, MD       [facility] Hold] sodium chloride flush (NS) 0.9 % injection 3 mL  3 mL Intravenous PRN Sandi Mariscal, MD        REVIEW OF SYSTEMS:   Constitutional: ( - ) fevers, ( - )  chills , ( - ) night sweats Eyes: ( - ) blurriness of vision, ( - ) double vision,  ( - ) watery eyes Ears, nose, mouth, throat, and face: ( - ) mucositis, ( - ) sore throat Respiratory: ( - ) cough, ( - ) dyspnea, ( - ) wheezes Cardiovascular: ( - ) palpitation, ( - ) chest discomfort, ( - ) lower extremity swelling Gastrointestinal:  ( - ) nausea, ( - ) heartburn, ( - ) change in bowel habits Skin: ( - ) abnormal skin rashes Lymphatics: ( - ) new lymphadenopathy, ( - ) easy bruising Neurological: ( - ) numbness, ( - ) tingling, ( - ) new weaknesses Behavioral/Psych: ( - ) mood change, ( - ) new changes  All other systems were reviewed with the patient and are negative.  PHYSICAL EXAMINATION: ECOG PERFORMANCE STATUS: 0 - Asymptomatic  Vitals:   07/07/21 1626  BP: 139/83  Pulse: 81  Resp: 18  Temp: (!) 97.2 F (36.2 C)  SpO2: 100%   Filed Weights   07/07/21 1626  Weight: 192 lb 5 oz (87.2 kg)    GENERAL: well appearing young African-American female in NAD  SKIN: skin color, texture, turgor are normal, no rashes or significant lesions EYES: conjunctiva are pink and non-injected, sclera clear LUNGS: clear to auscultation and percussion with normal breathing effort HEART: regular rate & rhythm and no murmurs and no lower extremity edema Musculoskeletal: no cyanosis of digits and no clubbing  PSYCH: alert & oriented x 3, fluent speech NEURO: no focal motor/sensory deficits  LABORATORY DATA:  I have reviewed the data as listed CBC Latest Ref Rng & Units 07/07/2021 07/03/2021 05/21/2021  WBC 4.0 - 10.5 K/uL 4.8 3.4(L) 4.9  Hemoglobin 12.0 - 15.0 g/dL 12.0 11.2(L) 8.3(L)  Hematocrit 36.0 - 46.0 % 38.9 38.3 29.0(L)  Platelets 150 - 400 K/uL 296 241 281    CMP Latest Ref Rng & Units 07/08/2021 07/03/2021 05/21/2021  Glucose 70 - 99 mg/dL 90 85 81  BUN 6 - 20 mg/dL '11 11 9  ' Creatinine 0.44 - 1.00 mg/dL 0.58 0.69 0.77  Sodium 135 - 145 mmol/L 137 136 139  Potassium 3.5 - 5.1 mmol/L 3.4(L) 3.8 3.7  Chloride 98 - 111 mmol/L 107 106 106  CO2 22 - 32 mmol/L 21(L)  22 23  Calcium 8.9 - 10.3 mg/dL 8.9 8.6(L) 9.0  Total Protein 6.5 - 8.1 g/dL - 7.4 7.4  Total Bilirubin 0.3 - 1.2 mg/dL - 0.3 0.3  Alkaline Phos 38 - 126 U/L - 33(L) 40  AST 15 - 41 U/L - 19 18  ALT 0 - 44 U/L - 17 14    RADIOGRAPHIC STUDIES: IR Angiogram Pelvis Selective Or Supraselective  Result Date: 07/08/2021 INDICATION: Symptomatic uterine fibroids. Please perform uterine artery embolization prior to definitive hysterectomy. EXAM: 1. ULTRASOUND GUIDANCE FOR ARTERIAL ACCESS 2. SELECTIVE BILATERAL INTERNAL ILIAC ARTERIOGRAMS 3. SUB SELECTIVE BILATERAL UTERINE ARTERY ARTERIOGRAM AND PERCUTANEOUS PARTICLE EMBOLIZATION. MEDICATIONS: Toradol 30 mg IV; Ancef 2 gm IV. The antibiotic was administered within one hour of the procedure ANESTHESIA/SEDATION: Moderate (conscious) sedation was employed during this procedure as administered by the Interventional Radiology RN. A total of Versed 4 mg and Fentanyl 200 mcg was administered intravenously. Moderate Sedation Time: 88 minutes. The patient's level of consciousness and vital signs were monitored continuously by radiology nursing throughout the procedure under my direct supervision. CONTRAST:  220 cc Omnipaque 300 FLUOROSCOPY TIME:  38 minutes, 36 seconds (2,202 mGy) COMPLICATIONS: None immediate. PROCEDURE: Informed consent was obtained from the patient following explanation of the procedure, risks, benefits and alternatives. The patient understands, agrees and consents for the procedure. All questions were addressed. A time out was performed prior to the initiation of the procedure. Maximal barrier sterile technique utilized including caps, mask, sterile gowns, sterile gloves, large sterile drape, hand hygiene, and chlorhexidine prep. The right femoral head was marked fluoroscopically. Under sterile conditions and local anesthesia, the right common femoral artery access was performed with a micropuncture needle. Under direct ultrasound guidance, the right  common femoral was accessed with a micropuncture kit. An ultrasound image was saved for documentation purposes. This allowed for placement of a 5-French vascular sheath. A limited arteriogram was performed through the side arm of the sheath confirming appropriate access within the right common femoral artery. The 5-French C2 catheter was utilized to select the contralateral left internal iliac artery. Selective left internal iliac angiogram was performed. The tortuous left uterine artery was identified. Selective catheterization was performed of the left uterine artery with a microcatheter and micro guide wire. A selective left uterine angiogram was performed. This demonstrated patency of the left uterine artery. Mild diffuse hypervascularity of the enlarged fibroid uterus. Access was adequate for embolization. For embolization, 2 vials of 500 - 700 micron Embospheres and 4 vials of 700-900 micron Embospheres were injected into the left uterine artery. Post embolization angiogram confirms complete stasis of the left uterine vascular territory. The Microcatheter was removed. The C2 catheter was retracted and utilized to select the right internal iliac artery. Selective right internal iliac angiogram was performed. The patent right uterine artery was identified. For selective catheterization, the micro catheter and guidewire were utilized to select the right uterine artery. Selective right uterine angiogram was performed. This demonstrated patency of  the right uterine artery. Catheter position was safe for embolization. Embolization was performed to complete stasis with injection of 2 vials of 500 - 700 micron Embospheres and 2 vials of 700 - 900 micron Embospheres. Post embolization angiogram confirms complete stasis of the right uterine vascular territory. At this point, all wires, catheters and sheaths were removed from the patient. Hemostasis was achieved at the right groin access site with manual compression.  Dressings were applied. The patient tolerated the procedure well without immediate post procedural complication. FINDINGS: Bilateral internal iliac arteriograms demonstrates conventional branching pattern with widely patent origins of the bilateral uterine arteries. Sub selective bilateral uterine arteriograms demonstrate co-dominant supply to a hypertrophied myomatous uterus. Completion arteriograms following bilateral uterine artery particle embolization demonstrates a technically excellent result with stasis of flow within the bilateral uterine vascular territories. IMPRESSION: Successful bilateral uterine artery embolization (U F E). PLAN: Patient will be admitted overnight for PCA usage observation and will undergo definitive hysterectomy tomorrow. Electronically Signed   By: Sandi Mariscal M.D.   On: 07/08/2021 13:51   IR Angiogram Pelvis Selective Or Supraselective  Result Date: 07/08/2021 INDICATION: Symptomatic uterine fibroids. Please perform uterine artery embolization prior to definitive hysterectomy. EXAM: 1. ULTRASOUND GUIDANCE FOR ARTERIAL ACCESS 2. SELECTIVE BILATERAL INTERNAL ILIAC ARTERIOGRAMS 3. SUB SELECTIVE BILATERAL UTERINE ARTERY ARTERIOGRAM AND PERCUTANEOUS PARTICLE EMBOLIZATION. MEDICATIONS: Toradol 30 mg IV; Ancef 2 gm IV. The antibiotic was administered within one hour of the procedure ANESTHESIA/SEDATION: Moderate (conscious) sedation was employed during this procedure as administered by the Interventional Radiology RN. A total of Versed 4 mg and Fentanyl 200 mcg was administered intravenously. Moderate Sedation Time: 88 minutes. The patient's level of consciousness and vital signs were monitored continuously by radiology nursing throughout the procedure under my direct supervision. CONTRAST:  220 cc Omnipaque 300 FLUOROSCOPY TIME:  38 minutes, 36 seconds (6,195 mGy) COMPLICATIONS: None immediate. PROCEDURE: Informed consent was obtained from the patient following explanation of the  procedure, risks, benefits and alternatives. The patient understands, agrees and consents for the procedure. All questions were addressed. A time out was performed prior to the initiation of the procedure. Maximal barrier sterile technique utilized including caps, mask, sterile gowns, sterile gloves, large sterile drape, hand hygiene, and chlorhexidine prep. The right femoral head was marked fluoroscopically. Under sterile conditions and local anesthesia, the right common femoral artery access was performed with a micropuncture needle. Under direct ultrasound guidance, the right common femoral was accessed with a micropuncture kit. An ultrasound image was saved for documentation purposes. This allowed for placement of a 5-French vascular sheath. A limited arteriogram was performed through the side arm of the sheath confirming appropriate access within the right common femoral artery. The 5-French C2 catheter was utilized to select the contralateral left internal iliac artery. Selective left internal iliac angiogram was performed. The tortuous left uterine artery was identified. Selective catheterization was performed of the left uterine artery with a microcatheter and micro guide wire. A selective left uterine angiogram was performed. This demonstrated patency of the left uterine artery. Mild diffuse hypervascularity of the enlarged fibroid uterus. Access was adequate for embolization. For embolization, 2 vials of 500 - 700 micron Embospheres and 4 vials of 700-900 micron Embospheres were injected into the left uterine artery. Post embolization angiogram confirms complete stasis of the left uterine vascular territory. The Microcatheter was removed. The C2 catheter was retracted and utilized to select the right internal iliac artery. Selective right internal iliac angiogram was performed. The patent right uterine artery  was identified. For selective catheterization, the micro catheter and guidewire were utilized to  select the right uterine artery. Selective right uterine angiogram was performed. This demonstrated patency of the right uterine artery. Catheter position was safe for embolization. Embolization was performed to complete stasis with injection of 2 vials of 500 - 700 micron Embospheres and 2 vials of 700 - 900 micron Embospheres. Post embolization angiogram confirms complete stasis of the right uterine vascular territory. At this point, all wires, catheters and sheaths were removed from the patient. Hemostasis was achieved at the right groin access site with manual compression. Dressings were applied. The patient tolerated the procedure well without immediate post procedural complication. FINDINGS: Bilateral internal iliac arteriograms demonstrates conventional branching pattern with widely patent origins of the bilateral uterine arteries. Sub selective bilateral uterine arteriograms demonstrate co-dominant supply to a hypertrophied myomatous uterus. Completion arteriograms following bilateral uterine artery particle embolization demonstrates a technically excellent result with stasis of flow within the bilateral uterine vascular territories. IMPRESSION: Successful bilateral uterine artery embolization (U F E). PLAN: Patient will be admitted overnight for PCA usage observation and will undergo definitive hysterectomy tomorrow. Electronically Signed   By: Sandi Mariscal M.D.   On: 07/08/2021 13:51   IR Angiogram Selective Each Additional Vessel  Result Date: 07/08/2021 INDICATION: Symptomatic uterine fibroids. Please perform uterine artery embolization prior to definitive hysterectomy. EXAM: 1. ULTRASOUND GUIDANCE FOR ARTERIAL ACCESS 2. SELECTIVE BILATERAL INTERNAL ILIAC ARTERIOGRAMS 3. SUB SELECTIVE BILATERAL UTERINE ARTERY ARTERIOGRAM AND PERCUTANEOUS PARTICLE EMBOLIZATION. MEDICATIONS: Toradol 30 mg IV; Ancef 2 gm IV. The antibiotic was administered within one hour of the procedure ANESTHESIA/SEDATION: Moderate  (conscious) sedation was employed during this procedure as administered by the Interventional Radiology RN. A total of Versed 4 mg and Fentanyl 200 mcg was administered intravenously. Moderate Sedation Time: 88 minutes. The patient's level of consciousness and vital signs were monitored continuously by radiology nursing throughout the procedure under my direct supervision. CONTRAST:  220 cc Omnipaque 300 FLUOROSCOPY TIME:  38 minutes, 36 seconds (1,610 mGy) COMPLICATIONS: None immediate. PROCEDURE: Informed consent was obtained from the patient following explanation of the procedure, risks, benefits and alternatives. The patient understands, agrees and consents for the procedure. All questions were addressed. A time out was performed prior to the initiation of the procedure. Maximal barrier sterile technique utilized including caps, mask, sterile gowns, sterile gloves, large sterile drape, hand hygiene, and chlorhexidine prep. The right femoral head was marked fluoroscopically. Under sterile conditions and local anesthesia, the right common femoral artery access was performed with a micropuncture needle. Under direct ultrasound guidance, the right common femoral was accessed with a micropuncture kit. An ultrasound image was saved for documentation purposes. This allowed for placement of a 5-French vascular sheath. A limited arteriogram was performed through the side arm of the sheath confirming appropriate access within the right common femoral artery. The 5-French C2 catheter was utilized to select the contralateral left internal iliac artery. Selective left internal iliac angiogram was performed. The tortuous left uterine artery was identified. Selective catheterization was performed of the left uterine artery with a microcatheter and micro guide wire. A selective left uterine angiogram was performed. This demonstrated patency of the left uterine artery. Mild diffuse hypervascularity of the enlarged fibroid  uterus. Access was adequate for embolization. For embolization, 2 vials of 500 - 700 micron Embospheres and 4 vials of 700-900 micron Embospheres were injected into the left uterine artery. Post embolization angiogram confirms complete stasis of the left uterine vascular territory. The  Microcatheter was removed. The C2 catheter was retracted and utilized to select the right internal iliac artery. Selective right internal iliac angiogram was performed. The patent right uterine artery was identified. For selective catheterization, the micro catheter and guidewire were utilized to select the right uterine artery. Selective right uterine angiogram was performed. This demonstrated patency of the right uterine artery. Catheter position was safe for embolization. Embolization was performed to complete stasis with injection of 2 vials of 500 - 700 micron Embospheres and 2 vials of 700 - 900 micron Embospheres. Post embolization angiogram confirms complete stasis of the right uterine vascular territory. At this point, all wires, catheters and sheaths were removed from the patient. Hemostasis was achieved at the right groin access site with manual compression. Dressings were applied. The patient tolerated the procedure well without immediate post procedural complication. FINDINGS: Bilateral internal iliac arteriograms demonstrates conventional branching pattern with widely patent origins of the bilateral uterine arteries. Sub selective bilateral uterine arteriograms demonstrate co-dominant supply to a hypertrophied myomatous uterus. Completion arteriograms following bilateral uterine artery particle embolization demonstrates a technically excellent result with stasis of flow within the bilateral uterine vascular territories. IMPRESSION: Successful bilateral uterine artery embolization (U F E). PLAN: Patient will be admitted overnight for PCA usage observation and will undergo definitive hysterectomy tomorrow. Electronically  Signed   By: Sandi Mariscal M.D.   On: 07/08/2021 13:51   IR Angiogram Selective Each Additional Vessel  Result Date: 07/08/2021 INDICATION: Symptomatic uterine fibroids. Please perform uterine artery embolization prior to definitive hysterectomy. EXAM: 1. ULTRASOUND GUIDANCE FOR ARTERIAL ACCESS 2. SELECTIVE BILATERAL INTERNAL ILIAC ARTERIOGRAMS 3. SUB SELECTIVE BILATERAL UTERINE ARTERY ARTERIOGRAM AND PERCUTANEOUS PARTICLE EMBOLIZATION. MEDICATIONS: Toradol 30 mg IV; Ancef 2 gm IV. The antibiotic was administered within one hour of the procedure ANESTHESIA/SEDATION: Moderate (conscious) sedation was employed during this procedure as administered by the Interventional Radiology RN. A total of Versed 4 mg and Fentanyl 200 mcg was administered intravenously. Moderate Sedation Time: 88 minutes. The patient's level of consciousness and vital signs were monitored continuously by radiology nursing throughout the procedure under my direct supervision. CONTRAST:  220 cc Omnipaque 300 FLUOROSCOPY TIME:  38 minutes, 36 seconds (4,235 mGy) COMPLICATIONS: None immediate. PROCEDURE: Informed consent was obtained from the patient following explanation of the procedure, risks, benefits and alternatives. The patient understands, agrees and consents for the procedure. All questions were addressed. A time out was performed prior to the initiation of the procedure. Maximal barrier sterile technique utilized including caps, mask, sterile gowns, sterile gloves, large sterile drape, hand hygiene, and chlorhexidine prep. The right femoral head was marked fluoroscopically. Under sterile conditions and local anesthesia, the right common femoral artery access was performed with a micropuncture needle. Under direct ultrasound guidance, the right common femoral was accessed with a micropuncture kit. An ultrasound image was saved for documentation purposes. This allowed for placement of a 5-French vascular sheath. A limited arteriogram was  performed through the side arm of the sheath confirming appropriate access within the right common femoral artery. The 5-French C2 catheter was utilized to select the contralateral left internal iliac artery. Selective left internal iliac angiogram was performed. The tortuous left uterine artery was identified. Selective catheterization was performed of the left uterine artery with a microcatheter and micro guide wire. A selective left uterine angiogram was performed. This demonstrated patency of the left uterine artery. Mild diffuse hypervascularity of the enlarged fibroid uterus. Access was adequate for embolization. For embolization, 2 vials of 500 - 700 micron  Embospheres and 4 vials of 700-900 micron Embospheres were injected into the left uterine artery. Post embolization angiogram confirms complete stasis of the left uterine vascular territory. The Microcatheter was removed. The C2 catheter was retracted and utilized to select the right internal iliac artery. Selective right internal iliac angiogram was performed. The patent right uterine artery was identified. For selective catheterization, the micro catheter and guidewire were utilized to select the right uterine artery. Selective right uterine angiogram was performed. This demonstrated patency of the right uterine artery. Catheter position was safe for embolization. Embolization was performed to complete stasis with injection of 2 vials of 500 - 700 micron Embospheres and 2 vials of 700 - 900 micron Embospheres. Post embolization angiogram confirms complete stasis of the right uterine vascular territory. At this point, all wires, catheters and sheaths were removed from the patient. Hemostasis was achieved at the right groin access site with manual compression. Dressings were applied. The patient tolerated the procedure well without immediate post procedural complication. FINDINGS: Bilateral internal iliac arteriograms demonstrates conventional branching  pattern with widely patent origins of the bilateral uterine arteries. Sub selective bilateral uterine arteriograms demonstrate co-dominant supply to a hypertrophied myomatous uterus. Completion arteriograms following bilateral uterine artery particle embolization demonstrates a technically excellent result with stasis of flow within the bilateral uterine vascular territories. IMPRESSION: Successful bilateral uterine artery embolization (U F E). PLAN: Patient will be admitted overnight for PCA usage observation and will undergo definitive hysterectomy tomorrow. Electronically Signed   By: Sandi Mariscal M.D.   On: 07/08/2021 13:51   IR US Guide Vasc Access Right  Result Date: 07/08/2021 INDICATION: Symptomatic uterine fibroids. Please perform uterine artery embolization prior to definitive hysterectomy. EXAM: 1. ULTRASOUND GUIDANCE FOR ARTERIAL ACCESS 2. SELECTIVE BILATERAL INTERNAL ILIAC ARTERIOGRAMS 3. SUB SELECTIVE BILATERAL UTERINE ARTERY ARTERIOGRAM AND PERCUTANEOUS PARTICLE EMBOLIZATION. MEDICATIONS: Toradol 30 mg IV; Ancef 2 gm IV. The antibiotic was administered within one hour of the procedure ANESTHESIA/SEDATION: Moderate (conscious) sedation was employed during this procedure as administered by the Interventional Radiology RN. A total of Versed 4 mg and Fentanyl 200 mcg was administered intravenously. Moderate Sedation Time: 88 minutes. The patient's level of consciousness and vital signs were monitored continuously by radiology nursing throughout the procedure under my direct supervision. CONTRAST:  220 cc Omnipaque 300 FLUOROSCOPY TIME:  38 minutes, 36 seconds (8,341 mGy) COMPLICATIONS: None immediate. PROCEDURE: Informed consent was obtained from the patient following explanation of the procedure, risks, benefits and alternatives. The patient understands, agrees and consents for the procedure. All questions were addressed. A time out was performed prior to the initiation of the procedure. Maximal  barrier sterile technique utilized including caps, mask, sterile gowns, sterile gloves, large sterile drape, hand hygiene, and chlorhexidine prep. The right femoral head was marked fluoroscopically. Under sterile conditions and local anesthesia, the right common femoral artery access was performed with a micropuncture needle. Under direct ultrasound guidance, the right common femoral was accessed with a micropuncture kit. An ultrasound image was saved for documentation purposes. This allowed for placement of a 5-French vascular sheath. A limited arteriogram was performed through the side arm of the sheath confirming appropriate access within the right common femoral artery. The 5-French C2 catheter was utilized to select the contralateral left internal iliac artery. Selective left internal iliac angiogram was performed. The tortuous left uterine artery was identified. Selective catheterization was performed of the left uterine artery with a microcatheter and micro guide wire. A selective left uterine angiogram was performed. This demonstrated  patency of the left uterine artery. Mild diffuse hypervascularity of the enlarged fibroid uterus. Access was adequate for embolization. For embolization, 2 vials of 500 - 700 micron Embospheres and 4 vials of 700-900 micron Embospheres were injected into the left uterine artery. Post embolization angiogram confirms complete stasis of the left uterine vascular territory. The Microcatheter was removed. The C2 catheter was retracted and utilized to select the right internal iliac artery. Selective right internal iliac angiogram was performed. The patent right uterine artery was identified. For selective catheterization, the micro catheter and guidewire were utilized to select the right uterine artery. Selective right uterine angiogram was performed. This demonstrated patency of the right uterine artery. Catheter position was safe for embolization. Embolization was performed to  complete stasis with injection of 2 vials of 500 - 700 micron Embospheres and 2 vials of 700 - 900 micron Embospheres. Post embolization angiogram confirms complete stasis of the right uterine vascular territory. At this point, all wires, catheters and sheaths were removed from the patient. Hemostasis was achieved at the right groin access site with manual compression. Dressings were applied. The patient tolerated the procedure well without immediate post procedural complication. FINDINGS: Bilateral internal iliac arteriograms demonstrates conventional branching pattern with widely patent origins of the bilateral uterine arteries. Sub selective bilateral uterine arteriograms demonstrate co-dominant supply to a hypertrophied myomatous uterus. Completion arteriograms following bilateral uterine artery particle embolization demonstrates a technically excellent result with stasis of flow within the bilateral uterine vascular territories. IMPRESSION: Successful bilateral uterine artery embolization (U F E). PLAN: Patient will be admitted overnight for PCA usage observation and will undergo definitive hysterectomy tomorrow. Electronically Signed   By: Sandi Mariscal M.D.   On: 07/08/2021 13:51   IR EMBO TUMOR ORGAN ISCHEMIA INFARCT INC GUIDE ROADMAPPING  Result Date: 07/08/2021 INDICATION: Symptomatic uterine fibroids. Please perform uterine artery embolization prior to definitive hysterectomy. EXAM: 1. ULTRASOUND GUIDANCE FOR ARTERIAL ACCESS 2. SELECTIVE BILATERAL INTERNAL ILIAC ARTERIOGRAMS 3. SUB SELECTIVE BILATERAL UTERINE ARTERY ARTERIOGRAM AND PERCUTANEOUS PARTICLE EMBOLIZATION. MEDICATIONS: Toradol 30 mg IV; Ancef 2 gm IV. The antibiotic was administered within one hour of the procedure ANESTHESIA/SEDATION: Moderate (conscious) sedation was employed during this procedure as administered by the Interventional Radiology RN. A total of Versed 4 mg and Fentanyl 200 mcg was administered intravenously. Moderate  Sedation Time: 88 minutes. The patient's level of consciousness and vital signs were monitored continuously by radiology nursing throughout the procedure under my direct supervision. CONTRAST:  220 cc Omnipaque 300 FLUOROSCOPY TIME:  38 minutes, 36 seconds (9,357 mGy) COMPLICATIONS: None immediate. PROCEDURE: Informed consent was obtained from the patient following explanation of the procedure, risks, benefits and alternatives. The patient understands, agrees and consents for the procedure. All questions were addressed. A time out was performed prior to the initiation of the procedure. Maximal barrier sterile technique utilized including caps, mask, sterile gowns, sterile gloves, large sterile drape, hand hygiene, and chlorhexidine prep. The right femoral head was marked fluoroscopically. Under sterile conditions and local anesthesia, the right common femoral artery access was performed with a micropuncture needle. Under direct ultrasound guidance, the right common femoral was accessed with a micropuncture kit. An ultrasound image was saved for documentation purposes. This allowed for placement of a 5-French vascular sheath. A limited arteriogram was performed through the side arm of the sheath confirming appropriate access within the right common femoral artery. The 5-French C2 catheter was utilized to select the contralateral left internal iliac artery. Selective left internal iliac angiogram was performed. The  tortuous left uterine artery was identified. Selective catheterization was performed of the left uterine artery with a microcatheter and micro guide wire. A selective left uterine angiogram was performed. This demonstrated patency of the left uterine artery. Mild diffuse hypervascularity of the enlarged fibroid uterus. Access was adequate for embolization. For embolization, 2 vials of 500 - 700 micron Embospheres and 4 vials of 700-900 micron Embospheres were injected into the left uterine artery. Post  embolization angiogram confirms complete stasis of the left uterine vascular territory. The Microcatheter was removed. The C2 catheter was retracted and utilized to select the right internal iliac artery. Selective right internal iliac angiogram was performed. The patent right uterine artery was identified. For selective catheterization, the micro catheter and guidewire were utilized to select the right uterine artery. Selective right uterine angiogram was performed. This demonstrated patency of the right uterine artery. Catheter position was safe for embolization. Embolization was performed to complete stasis with injection of 2 vials of 500 - 700 micron Embospheres and 2 vials of 700 - 900 micron Embospheres. Post embolization angiogram confirms complete stasis of the right uterine vascular territory. At this point, all wires, catheters and sheaths were removed from the patient. Hemostasis was achieved at the right groin access site with manual compression. Dressings were applied. The patient tolerated the procedure well without immediate post procedural complication. FINDINGS: Bilateral internal iliac arteriograms demonstrates conventional branching pattern with widely patent origins of the bilateral uterine arteries. Sub selective bilateral uterine arteriograms demonstrate co-dominant supply to a hypertrophied myomatous uterus. Completion arteriograms following bilateral uterine artery particle embolization demonstrates a technically excellent result with stasis of flow within the bilateral uterine vascular territories. IMPRESSION: Successful bilateral uterine artery embolization (U F E). PLAN: Patient will be admitted overnight for PCA usage observation and will undergo definitive hysterectomy tomorrow. Electronically Signed   By: Sandi Mariscal M.D.   On: 07/08/2021 13:51   IR Radiologist Eval & Mgmt  Result Date: 06/11/2021 Please refer to notes tab for details about interventional procedure. (Op Note)    ASSESSMENT & PLAN Teneisha Gignac is a 50 y.o. female returns for a follow up for iron deficiency anemia.  # Iron Deficiency Anemia 2/2 to GYN Bleeding -- Findings are consistent with iron deficiency anemia secondary to patient's menorrhagia --referred to increase Hgb to >11.0 prior to surgery. Under the care of Dr. Berline Lopes with Gyn/Onc  --Received IV venofer x 5 doses from 06/02/2021-06/30/2021.  --Labs today show that anemia has resolved with Hgb 12.0. Iron panel shows improvement of iron deficiency with ferritin 154, serum iron 59 and iron saturation 18%.. --Recommend to continue on oral iron tablets once daily with a source of vitamin C. --Continue to eat iron rich foods  --RTC in 3 months with repeat labs.   #Family History of Venous Thromboembolism --Hypercoagulable workup from October 2022 was unremarkable and did not find any signs of a clotting disorder or genetic clotting disorder.  --Do not recommend Xarelto as thromboprophylaxis in patients with no personal history of VTE --Caprini Score for upcoming laparoscopic hysterectomy and salpingectomy is 7 with an approximate 4% VTE risk.  --Recommendation includes pneumatic compression and 10 days of Lovenox 40 mg once daily.Sent prescription today.   No orders of the defined types were placed in this encounter.   All questions were answered. The patient knows to call the clinic with any problems, questions or concerns.  I have spent a total of 30 minutes minutes of face-to-face and non-face-to-face time, preparing to see the  patient, performing a medically appropriate examination, counseling and educating the patient, ordering medications, documenting clinical information in the electronic health record, and care coordination.    Lincoln Brigham, PA-C Department of Hematology/Oncology [place] [facility] Phone: [phone]   07/09/2021 3:15 PM

## 2021-07-09 NOTE — Anesthesia Procedure Notes (Signed)
Date/Time: 07/09/2021 6:23 PM Performed by: Cynda Familia, CRNA Oxygen Delivery Method: Simple face mask Placement Confirmation: breath sounds checked- equal and bilateral and positive ETCO2 Dental Injury: Teeth and Oropharynx as per pre-operative assessment

## 2021-07-09 NOTE — Transfer of Care (Signed)
Immediate Anesthesia Transfer of Care Note  Patient: Betty Gutierrez  Procedure(s) Performed: XI ROBOTIC ASSISTED LAPAROSCOPIC HYSTERECTOMY GREATER THAN TWO HUNDRED AND FIFTY GRAMS AND SALPINGECTOMY,MINI LAPAROTOMY cystoscopy (Bilateral: Abdomen)  Patient Location: PACU  Anesthesia Type:General  Level of Consciousness: sedated  Airway & Oxygen Therapy: Patient Spontanous Breathing and Patient connected to face mask oxygen  Post-op Assessment: Report given to RN and Post -op Vital signs reviewed and stable  Post vital signs: Reviewed and stable  Last Vitals:  Vitals Value Taken Time  BP    Temp    Pulse 64 07/09/21 1834  Resp 13 07/09/21 1834  SpO2 100 % 07/09/21 1834  Vitals shown include unvalidated device data.  Last Pain:  Vitals:   07/09/21 1215  TempSrc: Oral  PainSc:       Patients Stated Pain Goal: 3 (67/61/95 0932)  Complications: No notable events documented.

## 2021-07-09 NOTE — Progress Notes (Signed)
Patient is s/p bilateral uterine artery angiogram with embolization of uterine fibroids with Dr. Pascal Lux yesterday.   Patient is scheduled for hysterectomy today and she will be discharged per GYN oncology.  Went to the patient's room to eval right CFA puncture site, NT states that she just went to the OR. Will attempt to check the puncture site later today or tomorrow.  Rube Sanchez H Kynzleigh Bandel PA-C 07/09/2021 1:05 PM

## 2021-07-09 NOTE — Anesthesia Preprocedure Evaluation (Addendum)
Anesthesia Evaluation  Patient identified by MRN, date of birth, ID band Patient awake    Reviewed: Allergy & Precautions, NPO status , Patient's Chart, lab work & pertinent test results  History of Anesthesia Complications Negative for: history of anesthetic complications  Airway Mallampati: II  TM Distance: >3 FB Neck ROM: Full    Dental no notable dental hx. (+) Dental Advisory Given   Pulmonary neg pulmonary ROS,    Pulmonary exam normal        Cardiovascular negative cardio ROS Normal cardiovascular exam     Neuro/Psych negative neurological ROS     GI/Hepatic negative GI ROS, Neg liver ROS,   Endo/Other  negative endocrine ROS  Renal/GU negative Renal ROS     Musculoskeletal negative musculoskeletal ROS (+)   Abdominal   Peds  Hematology  (+) anemia ,   Anesthesia Other Findings   Reproductive/Obstetrics                            Anesthesia Physical Anesthesia Plan  ASA: 2  Anesthesia Plan: General   Post-op Pain Management: Tylenol PO (pre-op) and Celebrex PO (pre-op)   Induction:   PONV Risk Score and Plan: 4 or greater and Ondansetron, Dexamethasone, Midazolam and Scopolamine patch - Pre-op  Airway Management Planned: Oral ETT  Additional Equipment:   Intra-op Plan:   Post-operative Plan: Extubation in OR  Informed Consent: I have reviewed the patients History and Physical, chart, labs and discussed the procedure including the risks, benefits and alternatives for the proposed anesthesia with the patient or authorized representative who has indicated his/her understanding and acceptance.     Dental advisory given  Plan Discussed with: Anesthesiologist, CRNA and Surgeon  Anesthesia Plan Comments:        Anesthesia Quick Evaluation

## 2021-07-09 NOTE — Progress Notes (Signed)
°  Transition of Care [facility]) Screening Note   Patient Details  Name: Betty Gutierrez Date of Birth: 1970-12-10   Transition of Care [facility]) CM/SW Contact:    Calvina Liptak, Marjie Skiff, RN Phone Number: 07/09/2021, 10:38 AM    Transition of Care Department [facility]) has reviewed patient and no TOC needs have been identified at this time. We will continue to monitor patient advancement through interdisciplinary progression rounds. If new patient transition needs arise, please place a TOC consult.

## 2021-07-09 NOTE — Progress Notes (Signed)
Assisted Dr. Tobias Alexander with right, left, ultrasound guided, transabdominal plane block. Side rails up, monitors on throughout procedure. See vital signs in flow sheet. Tolerated Procedure well.

## 2021-07-09 NOTE — Brief Op Note (Signed)
07/09/2021  6:21 PM  PATIENT:  Betty Gutierrez  50 y.o. female  PRE-OPERATIVE DIAGNOSIS:  LARGE FIBROID UTERUS  POST-OPERATIVE DIAGNOSIS:  LARGE FIBROID UTERUS  PROCEDURE:  Procedure(s): XI ROBOTIC ASSISTED LAPAROSCOPIC HYSTERECTOMY GREATER THAN TWO HUNDRED AND FIFTY GRAMS AND SALPINGECTOMY,MINI LAPAROTOMY cystoscopy (Bilateral)  SURGEON:  Surgeon(s) and Role:    Lafonda Mosses, MD - Primary    Lahoma Crocker, MD - Assisting  EBL:  200 mL   BLOOD ADMINISTERED:none  DRAINS: none   LOCAL MEDICATIONS USED:  TAP block with Exparel, 0.25% marcaine  SPECIMEN:  uterus, pedunculated fibroid, bilateral tubes  DISPOSITION OF SPECIMEN:  PATHOLOGY  COUNTS:  YES  TOURNIQUET:  * No tourniquets in log *  DICTATION: .Note written in EPIC  PLAN OF CARE: Admit for overnight observation  PATIENT DISPOSITION:  PACU - hemodynamically stable.   Delay start of Pharmacological VTE agent (>24hrs) due to surgical blood loss or risk of bleeding: no

## 2021-07-09 NOTE — Progress Notes (Signed)
Patient arrived to room from PACU via stretcher accompanied by RN. Patient alert and oriented on arrival not appearing to be in any acute distress. Patient with stable vitals on RA. Bedside report received from PACU RN. Will continue to monitor patient.

## 2021-07-10 ENCOUNTER — Encounter (HOSPITAL_COMMUNITY): Payer: Self-pay | Admitting: Gynecologic Oncology

## 2021-07-10 ENCOUNTER — Encounter: Payer: Self-pay | Admitting: Gynecologic Oncology

## 2021-07-10 DIAGNOSIS — D259 Leiomyoma of uterus, unspecified: Secondary | ICD-10-CM | POA: Diagnosis not present

## 2021-07-10 LAB — BASIC METABOLIC PANEL
Anion gap: 7 (ref 5–15)
BUN: 7 mg/dL (ref 6–20)
CO2: 25 mmol/L (ref 22–32)
Calcium: 7.9 mg/dL — ABNORMAL LOW (ref 8.9–10.3)
Chloride: 108 mmol/L (ref 98–111)
Creatinine, Ser: 0.54 mg/dL (ref 0.44–1.00)
GFR, Estimated: >60 mL/min (ref >60)
Glucose, Bld: 120 mg/dL — ABNORMAL HIGH (ref 70–99)
Potassium: 3.5 mmol/L (ref 3.5–5.1)
Sodium: 140 mmol/L (ref 135–145)

## 2021-07-10 LAB — CBC
HCT: 31.9 % — ABNORMAL LOW (ref 36.0–46.0)
Hemoglobin: 9.7 g/dL — ABNORMAL LOW (ref 12.0–15.0)
MCH: 25.1 pg — ABNORMAL LOW (ref 26.0–34.0)
MCHC: 30.4 g/dL (ref 30.0–36.0)
MCV: 82.6 fL (ref 80.0–100.0)
Platelets: 211 10*3/uL (ref 150–400)
RBC: 3.86 MIL/uL — ABNORMAL LOW (ref 3.87–5.11)
WBC: 7.4 10*3/uL (ref 4.0–10.5)
nRBC: 0 % (ref 0.0–0.2)

## 2021-07-10 LAB — HEMOGLOBIN AND HEMATOCRIT, BLOOD
HCT: 31.6 % — ABNORMAL LOW (ref 36.0–46.0)
Hemoglobin: 9.7 g/dL — ABNORMAL LOW (ref 12.0–15.0)

## 2021-07-10 MED ORDER — ENOXAPARIN (LOVENOX) PATIENT EDUCATION KIT
PACK | Freq: Once | Status: AC
  Filled 2021-07-10: qty 1

## 2021-07-10 MED ORDER — ENOXAPARIN SODIUM 40 MG/0.4ML IJ SOSY
40.0000 mg | PREFILLED_SYRINGE | INTRAMUSCULAR | Status: DC
  Administered 2021-07-10: 40 mg via SUBCUTANEOUS
  Filled 2021-07-10: qty 0.4

## 2021-07-10 NOTE — Progress Notes (Signed)
Discharge instructions discussed with patient and family, verbalized agreement and understanding 

## 2021-07-10 NOTE — Discharge Instructions (Addendum)
07/10/2021  Return to work: 6 weeks if applicable  Beginning tomorrow around 2:30 pm, plan to administer one lovenox injection. You will continue this once a day for a full 10 days after surgery and plan to give it around the same time each day.   Avoid use of herbal teas or supplements for one week after surgery.  Activity: 1. Be up and out of the bed during the day.  Take a nap if needed.  You may walk up steps but be careful and use the hand rail.  Stair climbing will tire you more than you think, you may need to stop part way and rest.   2. No lifting or straining over 10 lbs, pushing, pulling, straining for 6 weeks.  3. No driving for 1 week(s).  Do not drive if you are taking narcotic pain medicine. You need to make sure your reaction time has returned and you can brake safely.  4. Shower daily.  Use your regular soap to bathe and when finished pat your incision dry; don't rub.  No tub baths until cleared by your surgeon.   5. No sexual activity and nothing in the vagina for 8 weeks.  6. You may experience a small amount of clear drainage from your incisions, which is normal.  If the drainage persists or increases, please call the office.  7. You may experience vaginal spotting after surgery or around the 6-8 week mark from surgery when the stitches at the top of the vagina begin to dissolve.  The spotting is normal but if you experience heavy bleeding, call our office.  8. Take Tylenol or ibuprofen first for pain and only use narcotic pain medication for severe pain not relieved by the Tylenol or Ibuprofen.  Monitor your Tylenol intake to a max of 4,000 mg.   Diet: 1. Low sodium Heart Healthy Diet is recommended.  2. It is safe to use a laxative, such as Miralax or Colace, if you have difficulty moving your bowels. You can take Sennakot at bedtime every evening to keep bowel movements regular and to prevent constipation.    Wound Care: 1. Keep clean and dry.  Shower  daily.  Reasons to call the Doctor: Fever - Oral temperature greater than 100.4 degrees Fahrenheit Foul-smelling vaginal discharge Difficulty urinating Nausea and vomiting Increased pain at the site of the incision that is unrelieved with pain medicine. Difficulty breathing with or without chest pain New calf pain especially if only on one side Sudden, continuing increased vaginal bleeding with or without clots.   Contacts: For questions or concerns you should contact:  Dr. Jeral Pinch at [phone]  Joylene John, NP at [phone]  After Hours: call [phone] and have the GYN Oncologist paged/contacted

## 2021-07-10 NOTE — Progress Notes (Signed)
GYN Oncology Progress Note  Betty Gutierrez is a 50 year old female currently admitted with uterine leiomyomas and endometrial hyperplasia. She is s/p uterine artery embolization with IR on 07/08/2021 followed by robotic-assisted laparoscopic total hysterectomy with bilateral salpingectomy, mini-lap for specimen delivery with contained morcellation, cystoscopy on 07/09/2021 with Dr. Jeral Pinch.  Subjective: Patient reports feeling groggy from the anesthesia. She ambulated in the halls three times since surgery and tolerated this well. Voiding without difficulty. Tolerated solid food for breakfast with no nausea or emesis reported. Denies passing flatus. Reports incisional discomfort that is relieved with prn medications. Denies chest pain, dyspnea. Able to get to 1250 on IS. States she is saddened because her nurse told her her ovaries were removed. No other concerns voiced.  Objective: Vital signs in last 24 hours: Temp:  [98.1 F (36.7 C)-99.2 F (37.3 C)] 99.2 F (37.3 C) (12/16 0459) Pulse Rate:  [57-91] 91 (12/16 0459) Resp:  [11-22] 16 (12/16 0459) BP: (116-142)/(62-89) 120/62 (12/16 0459) SpO2:  [97 %-100 %] 98 % (12/16 0459)    Intake/Output from previous day: 12/15 0701 - 12/16 0700 In: 3467.8 [P.O.:240; I.V.:2777.8; IV Piggyback:450] Out: 2850 [Urine:2650; Blood:200]  Physical Examination: General: alert, cooperative, and no distress Resp: clear to auscultation bilaterally Cardio: regular rate and rhythm, S1, S2 normal, no murmur, click, rub or gallop GI: incision: lap site incisions along with upper abdominal mini laparotomy incision intact with dermabond with no drainage noted and abdomen soft, tympanic on percussion, slightly hypoactive bowel sounds  Extremities: extremities normal, atraumatic, no cyanosis or edema  Labs: WBC/Hgb/Hct/Plts:  7.4/9.7/31.9/211 (12/16 0431) BUN/Cr/glu/ALT/AST/amyl/lip:  7/0.54/--/--/--/--/-- (12/16 0431)  Assessment: 50 y.o. s/p  uterine artery embolization with IR on 07/08/2021 followed by robotic-assisted laparoscopic total hysterectomy with bilateral salpingectomy, mini-lap for specimen delivery with contained morcellation, cystoscopy on 07/09/2021 with Dr. Jeral Pinch.  Pain:  Pain is well-controlled on PRN medications.  Heme: Hgb 9.7 and Hct 31.9 this am. Plan for repeat later this am to check for stability.   CV: BP and HR stable. Continue to monitor with ordered vital signs.   GI:  Tolerating po: Yes. Antiemetics ordered prn. Scopolamine patch in place.   GU: Voiding without difficulty. Creatinine 0.54 this am.   FEN: No critical values   Prophylaxis: SCDs on. Lovenox ordered. To continue with prophylactic lovenox for 10 days post-op  Plan: Saline lock IV Increase ambulation Plan for discharge later today if doing well   LOS: 0 days    Dorothyann Gibbs 07/10/2021, 9:55 AM

## 2021-07-10 NOTE — Discharge Summary (Signed)
Physician Discharge Summary  Patient ID: Betty Gutierrez MRN: [id number] DOB/AGE: [birth year] 49 y.o.  Admit date: 07/08/2021 Discharge date: 07/10/2021  Admission Diagnoses: Fibroids  Discharge Diagnoses:  Principal Problem:   Fibroids Active Problems:   Postoperative state   Discharged Condition:  The patient is in good condition and stable for discharge.  Hospital Course: Betty Gutierrez is a 50 year old female admitted with uterine leiomyomas and endometrial hyperplasia. She is s/p uterine artery embolization with IR on 07/08/2021 followed by robotic-assisted laparoscopic total hysterectomy with bilateral salpingectomy, mini-lap for specimen delivery with contained morcellation, cystoscopy on 07/09/2021 with Dr. Jeral Pinch. She is discharged home on POD 1 from surgery on 07/09/2021 tolerating her diet, ambulating, voiding, pain controlled with prn medications. She is to perform once daily lovenox injections prophylactically for 10 days post-op per hematology recommendations.    Consults: IR uterine artery embolization performed 07/08/2021  Significant Diagnostic Studies: Labs  Treatments: Surgery: see above. IR uterine artery embolization.   Discharge Exam: Blood pressure (!) 97/54, pulse (!) 103, temperature 98.7 F (37.1 C), temperature source Oral, resp. rate 18, height 5\' 4"  (1.626 m), weight 192 lb 3.9 oz (87.2 kg), SpO2 99 %. General appearance: alert, cooperative, and no distress Resp: clear to auscultation bilaterally Cardio: regular rate and rhythm, S1, S2 normal, no murmur, click, rub or gallop GI: abdomen soft, mildly hypoactive bowel sounds Extremities: extremities normal, atraumatic, no cyanosis or edema Incision/Wound:  Lap site incisions along with upper abdominal mini laparotomy incision intact with dermabond with no drainage noted   Disposition: Discharge disposition: 01-Home or Self Care       Discharge Instructions     Call MD for:   difficulty breathing, headache or visual disturbances   Complete by: As directed    Call MD for:  extreme fatigue   Complete by: As directed    Call MD for:  hives   Complete by: As directed    Call MD for:  persistant dizziness or light-headedness   Complete by: As directed    Call MD for:  persistant nausea and vomiting   Complete by: As directed    Call MD for:  redness, tenderness, or signs of infection (pain, swelling, redness, odor or green/yellow discharge around incision site)   Complete by: As directed    Call MD for:  severe uncontrolled pain   Complete by: As directed    Call MD for:  temperature >100.4   Complete by: As directed    Diet - low sodium heart healthy   Complete by: As directed    Driving Restrictions   Complete by: As directed    No driving for 1 week.  Do not take narcotics and drive.   Increase activity slowly   Complete by: As directed    Lifting restrictions   Complete by: As directed    No lifting greater than 10 lbs, pushing, pulling, straining for 6 weeks.   No wound care   Complete by: As directed    Sexual Activity Restrictions   Complete by: As directed    No sexual activity, nothing in the vagina, for 8 weeks.      Allergies as of 07/10/2021       Reactions   Hydromorphone Nausea And Vomiting   Intolerance to narcotics         Medication List     TAKE these medications    enoxaparin 40 MG/0.4ML injection Commonly known as: LOVENOX Inject 0.4 mLs (40 mg total)  into the skin daily for 10 days.   ibuprofen 800 MG tablet Commonly known as: ADVIL Take 1 tablet (800 mg total) by mouth every 8 (eight) hours as needed for moderate pain. For AFTER surgery only   senna-docusate 8.6-50 MG tablet Commonly known as: Senokot-S Take 2 tablets by mouth at bedtime. For AFTER surgery, do not take if having diarrhea   traMADol 50 MG tablet Commonly known as: ULTRAM Take 1 tablet (50 mg total) by mouth every 6 (six) hours as needed for  severe pain. For AFTER surgery only, do not take and drive        Follow-up Information     Lafonda Mosses, MD Follow up on 07/31/2021.   Specialty: Gynecologic Oncology Why: at 1:30pm at the [facility] for after surgery check up. Contact information: [place] [address] [phone]                 Greater than thirty minutes were spend for face to face discharge instructions and discharge orders/summary in EPIC.   Signed: Dorothyann Gibbs 07/10/2021, 1:35 PM

## 2021-07-10 NOTE — Anesthesia Postprocedure Evaluation (Signed)
Anesthesia Post Note  Patient: Dedra Matsuo  Procedure(s) Performed: XI ROBOTIC ASSISTED LAPAROSCOPIC HYSTERECTOMY GREATER THAN TWO HUNDRED AND FIFTY GRAMS AND SALPINGECTOMY,MINI LAPAROTOMY cystoscopy (Bilateral: Abdomen)     Patient location during evaluation: PACU Anesthesia Type: General Level of consciousness: sedated Pain management: pain level controlled Vital Signs Assessment: post-procedure vital signs reviewed and stable Respiratory status: spontaneous breathing and respiratory function stable Cardiovascular status: stable Postop Assessment: no apparent nausea or vomiting Anesthetic complications: no   No notable events documented.  Last Vitals:  Vitals:   07/10/21 0100 07/10/21 0459  BP: 118/84 120/62  Pulse: 87 91  Resp: 18 16  Temp: 37 C 37.3 C  SpO2: 99% 98%    Last Pain:  Vitals:   07/10/21 0501  TempSrc:   PainSc: 3                  Kareemah Grounds DANIEL

## 2021-07-10 NOTE — TOC Initial Note (Signed)
Transition of Care [facility]) - Initial/Assessment Note    Patient Details  Name: Betty Gutierrez MRN: [id number] Date of Birth: [birth year]  Transition of Care [facility]) CM/SW Contact:    Leeroy Cha, RN Phone Number: 07/10/2021, 3:35 PM  Clinical Narrative:                 Pt discharged to go home with self care  Transition of Care [facility]) Screening Note   Patient Details  Name: Betty Gutierrez Date of Birth: [birth year]   Transition of Care [facility]) CM/SW Contact:    Leeroy Cha, RN Phone Number: 07/10/2021, 3:35 PM    Transition of Care Department [facility]) has reviewed patient and no TOC needs have been identified at this time. We will continue to monitor patient advancement through interdisciplinary progression rounds. If new patient transition needs arise, please place a TOC consult.   Expected Discharge Plan: Home/Self Care Barriers to Discharge: No Barriers Identified   Patient Goals and CMS Choice Patient states their goals for this hospitalization and ongoing recovery are:: to go home CMS Medicare.gov Compare Post Acute Care list provided to:: Patient    Expected Discharge Plan and Services Expected Discharge Plan: Home/Self Care   Discharge Planning Services: CM Consult   Living arrangements for the past 2 months: Single Family Home Expected Discharge Date: 07/10/21                                    Prior Living Arrangements/Services Living arrangements for the past 2 months: Single Family Home Lives with:: Self Patient language and need for interpreter reviewed:: Yes Do you feel safe going back to the place where you live?: Yes            Criminal Activity/Legal Involvement Pertinent to Current Situation/Hospitalization: No - Comment as needed  Activities of Daily Living Home Assistive Devices/Equipment: None ADL Screening (condition at time of admission) Patient's cognitive ability adequate to safely complete daily activities?:  Yes Is the patient deaf or have difficulty hearing?: No Does the patient have difficulty seeing, even when wearing glasses/contacts?: No Does the patient have difficulty concentrating, remembering, or making decisions?: No Patient able to express need for assistance with ADLs?: Yes Does the patient have difficulty dressing or bathing?: No Independently performs ADLs?: Yes (appropriate for developmental age) Does the patient have difficulty walking or climbing stairs?: No Weakness of Legs: None Weakness of Arms/Hands: None  Permission Sought/Granted                  Emotional Assessment Appearance:: Appears stated age Attitude/Demeanor/Rapport: Engaged Affect (typically observed): Calm Orientation: : Oriented to Place, Oriented to Self, Oriented to  Time, Oriented to Situation Alcohol / Substance Use: Not Applicable Psych Involvement: No (comment)  Admission diagnosis:  Uterine fibroid [D25.9] Fibroids [D21.9] Postoperative state [Z98.890] Patient Active Problem List   Diagnosis Date Noted   Postoperative state 07/09/2021   Uterine fibroid 07/08/2021   Fibroids 07/08/2021   Iron deficiency anemia due to chronic blood loss 05/21/2021   Endometrial hyperplasia 11/08/2018   Family history of pulmonary embolism 07/01/2017   PCP:  Servando Salina, MD Pharmacy:   [id number] - [place], [place] [address] [address] Phone: [phone] Fax: [phone]     Social Determinants of Health (SDOH) Interventions    Readmission Risk Interventions No flowsheet data found.

## 2021-07-13 ENCOUNTER — Telehealth: Payer: Self-pay

## 2021-07-13 NOTE — Telephone Encounter (Signed)
Attempted to reach patient, unable to contact. Left message requesting return call.

## 2021-07-14 LAB — SURGICAL PATHOLOGY

## 2021-07-14 NOTE — Telephone Encounter (Signed)
Spoke with Betty Gutierrez this morning. She states she is eating, drinking and urinating well. She has had some nausea but reports this is from taking her pain medicine without food. Instructed to take pain medicine with food. She states she has been drinking fresh ginger tea to help with nausea and gas. She has had a BM. She is taking senokot as prescribed and encouraged her to drink plenty of water. She denies fever or chills. She rates her pain as 3-4/10 and describes this as manageable. Her pain is controlled with alternating tylenol and ibuprofen and tramadol as needed.  She reports her larger midline incision is warm to the touch and has some swelling. She denies redness or drainage. Patient is going to send picture via mychart. Instructed that Betty John, NP will be notified and someone from our office will follow up with patient.   Instructed to call office with any fever, chills, purulent drainage, uncontrolled pain or any other questions or concerns. Patient verbalizes understanding.   Pt aware of post op appointments as well as the office number [phone] and after hours number [phone] to call if she has any questions or concerns

## 2021-07-15 ENCOUNTER — Encounter: Payer: Self-pay | Admitting: Hematology and Oncology

## 2021-07-17 ENCOUNTER — Telehealth: Payer: Self-pay | Admitting: *Deleted

## 2021-07-17 NOTE — Telephone Encounter (Signed)
PC to patient, informed her that her surgical pathology from 07/09/21 shows no evidence of malignancy.  She verbalizes understanding.

## 2021-07-22 ENCOUNTER — Telehealth: Payer: Self-pay

## 2021-07-22 DIAGNOSIS — G4700 Insomnia, unspecified: Secondary | ICD-10-CM

## 2021-07-22 DIAGNOSIS — R82998 Other abnormal findings in urine: Secondary | ICD-10-CM

## 2021-07-22 NOTE — Telephone Encounter (Signed)
Received call from Ms. Pellicano and her son this afternoon. They have questions about medications prescribed after surgery. Patient reports she has finished her lovenox injections and tramadol. She has ibuprofen, tylenol, senokot and an iron supplement. She has had diarrhea and withheld her senokot and reports she has not been taking her iron supplement. She took 1,000mg  of tylenol this morning and rates her pain as 2/10. She reports not being able to sleep for 3 days and she feels tired, confused and angry and wants to know what medication is causing this.   Patient states that her urine is red but she does not believe it is blood but is unsure. She is unsure if there is blood on toilet tissue when she wipes but will monitor this. She does not have vaginal bleeding. She has some burning with urination, no odor, denies frequency.   She does not believe she has a fever but states she has been unable to regulate her body temperature with how cold it has been and her heat being out in part of her house. She has not taken her temperature.   She states she does not have an appetite and has not been eating much. Denies nausea and vomiting. She denies taking any new supplements or tea. She states she has only been having ginger. She is afraid to take anything. She denies having insomnia or trouble sleeping prior to this.   Patient son inquiring if ok to take benadryl to help with sleep.Joylene John, NP notified.  Advised patient that it is ok to take benadryl to help with sleep tonight (Per Joylene John, NP). Continue to take tylenol as needed for pain being careful not to exceed 4,000mg  in a 24 hour period.  Patient will need to come to cancer center to have labs drawn and her urine checked. Lab appointment scheduled for tomorrow morning 07/23/21 at 07:45 am. Patient verbalized understanding and is in agreement of appointment date and time.   Instructed to call with any needs. Patient has clinic phone number  [phone] as well as after hours number [phone].

## 2021-07-23 ENCOUNTER — Inpatient Hospital Stay: Payer: 59

## 2021-07-23 ENCOUNTER — Other Ambulatory Visit: Payer: Self-pay

## 2021-07-23 ENCOUNTER — Inpatient Hospital Stay (HOSPITAL_BASED_OUTPATIENT_CLINIC_OR_DEPARTMENT_OTHER): Payer: 59 | Admitting: Gynecologic Oncology

## 2021-07-23 VITALS — BP 154/104 | HR 104 | Temp 98.6°F | Ht 64.0 in | Wt 177.2 lb

## 2021-07-23 DIAGNOSIS — G4700 Insomnia, unspecified: Secondary | ICD-10-CM

## 2021-07-23 DIAGNOSIS — D500 Iron deficiency anemia secondary to blood loss (chronic): Secondary | ICD-10-CM | POA: Diagnosis not present

## 2021-07-23 DIAGNOSIS — E876 Hypokalemia: Secondary | ICD-10-CM

## 2021-07-23 DIAGNOSIS — R82998 Other abnormal findings in urine: Secondary | ICD-10-CM

## 2021-07-23 LAB — CBC (CANCER CENTER ONLY)
HCT: 37.2 % (ref 36.0–46.0)
Hemoglobin: 11.8 g/dL — ABNORMAL LOW (ref 12.0–15.0)
MCH: 25.8 pg — ABNORMAL LOW (ref 26.0–34.0)
MCHC: 31.7 g/dL (ref 30.0–36.0)
MCV: 81.4 fL (ref 80.0–100.0)
Platelet Count: 348 10*3/uL (ref 150–400)
RBC: 4.57 MIL/uL (ref 3.87–5.11)
WBC Count: 5.7 10*3/uL (ref 4.0–10.5)
nRBC: 0 % (ref 0.0–0.2)

## 2021-07-23 LAB — URINALYSIS, COMPLETE (UACMP) WITH MICROSCOPIC
Bacteria, UA: NONE SEEN
Bilirubin Urine: NEGATIVE
Glucose, UA: NEGATIVE mg/dL
Hgb urine dipstick: NEGATIVE
Ketones, ur: NEGATIVE mg/dL
Leukocytes,Ua: NEGATIVE
Nitrite: NEGATIVE
Protein, ur: NEGATIVE mg/dL
Specific Gravity, Urine: 1.019 (ref 1.005–1.030)
pH: 5 (ref 5.0–8.0)

## 2021-07-23 LAB — BASIC METABOLIC PANEL - CANCER CENTER ONLY
Anion gap: 8 (ref 5–15)
BUN: 11 mg/dL (ref 6–20)
CO2: 26 mmol/L (ref 22–32)
Calcium: 9.1 mg/dL (ref 8.9–10.3)
Chloride: 108 mmol/L (ref 98–111)
Creatinine: 0.77 mg/dL (ref 0.44–1.00)
GFR, Estimated: >60 mL/min (ref >60)
Glucose, Bld: 95 mg/dL (ref 70–99)
Potassium: 3.3 mmol/L — ABNORMAL LOW (ref 3.5–5.1)
Sodium: 142 mmol/L (ref 135–145)

## 2021-07-23 NOTE — Patient Instructions (Signed)
Plan to stop taking all medications given to you for after surgery except for tylenol and ibuprofen if needed for pain. You can get ibuprofen over the counter (200 mg) and take one to two tablets if you need this.   You can resume your teas at home along with the tea you would take at bedtime. During the day, try to limit naps/sleeping and avoid caffeine.   Your potassium is slightly low. You can increase your intake of potassium rich foods to help increase this.   Plan to follow up as scheduled with Dr. Berline Lopes or sooner if needed. Please call the office for any needs.  Potassium Content of Foods  Potassium is a mineral found in many foods and drinks. It helps keep fluids and minerals balanced in your body and affects how steadily your heart beats. Potassium also helps control your blood pressure and keep your muscles and nervous system healthy. Certain health conditions and medicines may change the balance of potassium in your body. When this happens, you can help balance your level of potassium through the foods that you do or do not eat. Your health care provider or dietitian may recommend an amount of potassium that you should have each day. The following lists of foods provide the amount of potassium (in parentheses) per serving in each item. High in potassium The following foods and beverages have 200 mg or more of potassium per serving: Apricots, 2 raw or 5 dry (200 mg). Artichoke, 1 medium (345 mg). Avocado, raw,  each (245 mg). Banana, 1 medium (425 mg). Beans, lima, or baked beans, canned,  cup (280 mg). Beans, white, canned,  cup (595 mg). Beef roast, 3 oz (320 mg). Beef, ground, 3 oz (270 mg). Beets, raw or cooked,  cup (260 mg). Bran muffin, 2 oz (300 mg). Broccoli,  cup (230 mg). Brussels sprouts,  cup (250 mg). Cantaloupe,  cup (215 mg). Cereal, 100% bran,  cup (200-400 mg). Cheeseburger, single, fast food, 1 each (225-400 mg). Chicken, 3 oz (220 mg). Clams,  canned, 3 oz (535 mg). Crab, 3 oz (225 mg). Dates, 5 each (270 mg). Dried beans and peas,  cup (300-475 mg). Figs, dried, 2 each (260 mg). Fish: halibut, tuna, cod, snapper, 3 oz (480 mg). Fish: salmon, haddock, swordfish, perch, 3 oz (300 mg). Fish, tuna, canned 3 oz (200 mg). [place] fries, fast food, 3 oz (470 mg). Granola with fruit and nuts,  cup (200 mg). Grapefruit juice,  cup (200 mg). Greens, beet,  cup (655 mg). Honeydew melon,  cup (200 mg). Kale, raw, 1 cup (300 mg). Kiwi, 1 medium (240 mg). Kohlrabi, rutabaga, parsnips,  cup (280 mg). Lentils,  cup (365 mg). Mango, 1 each (325 mg). Milk, chocolate, 1 cup (420 mg). Milk: nonfat, low-fat, whole, buttermilk, 1 cup (350-380 mg). Molasses, 1 Tbsp (295 mg). Mushrooms,  cup (280) mg. Nectarine, 1 each (275 mg). Nuts: almonds, peanuts, hazelnuts, [place], cashew, mixed, 1 oz (200 mg). Nuts, pistachios, 1 oz (295 mg). Orange, 1 each (240 mg). Orange juice,  cup (235 mg). Papaya, medium,  fruit (390 mg). Peanut butter, chunky, 2 Tbsp (240 mg). Peanut butter, smooth, 2 Tbsp (210 mg). Pear, 1 medium (200 mg). Pomegranate, 1 whole (400 mg). Pomegranate juice,  cup (215 mg). Pork, 3 oz (350 mg). Potato chips, salted, 1 oz (465 mg). Potato, baked with skin, 1 medium (925 mg). Potatoes, boiled,  cup (255 mg). Potatoes, mashed,  cup (330 mg). Prune juice,  cup (370 mg). Prunes,  5 each (305 mg). Pudding, chocolate,  cup (230 mg). Pumpkin, canned,  cup (250 mg). Raisins, seedless,  cup (270 mg). Seeds, sunflower or pumpkin, 1 oz (240 mg). Soy milk, 1 cup (300 mg). Spinach,  cup (420 mg). Spinach, canned,  cup (370 mg). Sweet potato, baked with skin, 1 medium (450 mg). Swiss chard,  cup (480 mg). Tomato or vegetable juice,  cup (275 mg). Tomato sauce or puree,  cup (400-550 mg). Tomato, raw, 1 medium (290 mg). Tomatoes, canned,  cup (200-300 mg). [place], 3 oz (250 mg). Wheat germ, 1 oz (250  mg). Winter squash,  cup (250 mg). Yogurt, plain or fruited, 6 oz (260-435 mg). Zucchini,  cup (220 mg).  Moderate in potassium The following foods and beverages have 50-200 mg of potassium per serving: Apple, 1 each (150 mg). Apple juice,  cup (150 mg). Applesauce,  cup (90 mg). Apricot nectar,  cup (140 mg). Asparagus, small spears,  cup or 6 spears (155 mg). Bagel, cinnamon raisin, 1 each (130 mg). Bagel, egg or plain, 4 in., 1 each (70 mg). Beans, green,  cup (90 mg). Beans, yellow,  cup (190 mg). Beer, regular, 12 oz (100 mg). Beets, canned,  cup (125 mg). Blackberries,  cup (115 mg). Blueberries,  cup (60 mg). Bread, whole wheat, 1 slice (70 mg). Broccoli, raw,  cup (145 mg). Cabbage,  cup (150 mg). Carrots, cooked or raw,  cup (180 mg). Cauliflower, raw,  cup (150 mg). Celery, raw,  cup (155 mg). Cereal, bran flakes, cup (120-150 mg). Cheese, cottage,  cup (110 mg). Cherries, 10 each (150 mg). Chocolate, 1 oz bar (165 mg). Coffee, brewed 6 oz (90 mg). Corn,  cup or 1 ear (195 mg). Cucumbers,  cup (80 mg). Egg, large, 1 each (60 mg). Eggplant,  cup (60 mg). Endive, raw, cup (80 mg). English muffin, 1 each (65 mg). Fish, orange roughy, 3 oz (150 mg). Frankfurter, beef or pork, 1 each (75 mg). Fruit cocktail,  cup (115 mg). Grape juice,  cup (170 mg). Grapefruit,  fruit (175 mg). Grapes,  cup (155 mg). Greens: kale, turnip, collard,  cup (110-150 mg). Ice cream or frozen yogurt, chocolate,  cup (175 mg). Ice cream or frozen yogurt, vanilla,  cup (120-150 mg). Lemons, limes, 1 each (80 mg). Lettuce, all types, 1 cup (100 mg). Mixed vegetables,  cup (150 mg). Mushrooms, raw,  cup (110 mg). Nuts: walnuts, pecans, or macadamia, 1 oz (125 mg). Oatmeal,  cup (80 mg). Okra,  cup (110 mg). Onions, raw,  cup (120 mg). Peach, 1 each (185 mg). Peaches, canned,  cup (120 mg). Pears, canned,  cup (120 mg). Peas, green, frozen,  cup  (90 mg). Peppers, green,  cup (130 mg). Peppers, red,  cup (160 mg). Pineapple juice,  cup (165 mg). Pineapple, fresh or canned,  cup (100 mg). Plums, 1 each (105 mg). Pudding, vanilla,  cup (150 mg). Raspberries,  cup (90 mg). Rhubarb,  cup (115 mg). Rice, wild,  cup (80 mg). Shrimp, 3 oz (155 mg). Spinach, raw, 1 cup (170 mg). Strawberries,  cup (125 mg). Summer squash  cup (175-200 mg). Swiss chard, raw, 1 cup (135 mg). Tangerines, 1 each (140 mg). Tea, brewed, 6 oz (65 mg). Turnips,  cup (140 mg). Watermelon,  cup (85 mg). Wine, red, table, 5 oz (180 mg). Wine, white, table, 5 oz (100 mg).  Low in potassium The following foods and beverages have less than 50 mg of potassium per serving. Bread, white, 1  slice (30 mg). Carbonated beverages, 12 oz (less than 5 mg). Cheese, 1 oz (20-30 mg). Cranberries,  cup (45 mg). Cranberry juice cocktail,  cup (20 mg). Fats and oils, 1 Tbsp (less than 5 mg). Hummus, 1 Tbsp (32 mg). Nectar: papaya, mango, or pear,  cup (35 mg). Rice, white or brown,  cup (50 mg). Spaghetti or macaroni,  cup cooked (30 mg). Tortilla, flour or corn, 1 each (50 mg). Waffle, 4 in., 1 each (50 mg). Water chestnuts,  cup (40 mg).  This information is not intended to replace advice given to you by your health care provider. Make sure you discuss any questions you have with your health care provider. Document Released: 02/23/2005 Document Revised: 12/18/2015 Document Reviewed: 06/08/2013 Elsevier Interactive Patient Education  2018 [employer].   Your updated BMI is 30.42

## 2021-07-23 NOTE — Progress Notes (Signed)
GYN Oncology Symptom Management Post-op  Betty Gutierrez is a 50 year old female s/p robotic-assisted laparoscopic total hysterectomy with bilateral salpingectomy, mini-lap for specimen delivery with contained morcellation, cystoscopy on 07/09/2021 with Dr. Jeral Pinch for a large fibroid uterus. She presents today to the office for lab work and evaluation given her post-operative symptoms.   She states she has not been able to sleep for the past three days. She feels wired. She tried benadryl last pm, which gave her a few hours of sleep but she was concerned about the risk of overdosing. She stopped taking tramadol several days ago. Prior to this, she was having intermittent hallucinations which she relates to medication use. She has been using ibuprofen and tylenol for pain and wonders if this could be contributing to her symptoms. She stopped taking the sennakot-S due to developing diarrhea. She has a decreased appetite but states she has been drinking adequate amounts. No emesis reported. She has had chills, issues regulating body temp, but no documented fever. She has also stopped taking iron.  No vaginal bleeding reported. She is using a donut pillow to sit on due to soreness on her vulva/bottom post-op. Her urine has been darker yellow with no dysuria or symptoms of infection. No drainage reported from her abdominal incisions. The dermabond is starting to peel up around the edges and the lower aspect of the mini lap incision.  CBC    Component Value Date/Time   WBC 5.7 07/23/2021 0803   WBC 7.4 07/10/2021 0431   RBC 4.57 07/23/2021 0803   HGB 11.8 (L) 07/23/2021 0803   HCT 37.2 07/23/2021 0803   PLT 348 07/23/2021 0803   MCV 81.4 07/23/2021 0803   MCH 25.8 (L) 07/23/2021 0803   MCHC 31.7 07/23/2021 0803   RDW Not Measured 07/23/2021 0803   LYMPHSABS 1.8 07/07/2021 1516   MONOABS 0.2 07/07/2021 1516   EOSABS 0.0 07/07/2021 1516   BASOSABS 0.0 07/07/2021 1516   BMET    Component  Value Date/Time   NA 142 07/23/2021 0803   K 3.3 (L) 07/23/2021 0803   CL 108 07/23/2021 0803   CO2 26 07/23/2021 0803   GLUCOSE 95 07/23/2021 0803   BUN 11 07/23/2021 0803   CREATININE 0.77 07/23/2021 0803   CALCIUM 9.1 07/23/2021 0803   GFRNONAA >60 07/23/2021 0803     Exam: Alert, oriented, in no acute distress but appears anxious about her symptoms. Lungs clear. Heart regular in rate and rhythm. Abdomen soft, active bowel sounds, incisions healing well without evidence of infection. No lower extremity edema.   Her labs from today were discussed. Given mild hypokalemia, we discussed increasing intake of potassium rich foods but not in excess. We reviewed her medications in detail. Advised to stop all medications given to her post-op and to use tylenol and ibuprofen (200-400 mg) as needed for pain. Before surgery, she would drink tea at bedtime that would help her sleep. She is advised to resume her teas she was taking pre-op to see if her insomnia will improve. We discussed good sleep habits and handout given. She does not want to add any new medications to her list at this time. She is advised to call for any needs. Our office will check in with her tomorrow. She is advised to follow up as scheduled with Dr. Berline Lopes on 1/6 or sooner if needed.

## 2021-07-24 ENCOUNTER — Telehealth: Payer: Self-pay

## 2021-07-24 NOTE — Telephone Encounter (Signed)
Attempted to reach Betty Gutierrez this afternoon to follow up on how she was feeling today. No answer. Left VM stating for pt to return call to our office.

## 2021-07-31 ENCOUNTER — Encounter: Payer: Self-pay | Admitting: Gynecologic Oncology

## 2021-07-31 ENCOUNTER — Other Ambulatory Visit: Payer: Self-pay

## 2021-07-31 ENCOUNTER — Telehealth: Payer: Self-pay | Admitting: Gynecologic Oncology

## 2021-07-31 ENCOUNTER — Inpatient Hospital Stay: Payer: 59

## 2021-07-31 ENCOUNTER — Inpatient Hospital Stay: Payer: 59 | Attending: Gynecologic Oncology | Admitting: Gynecologic Oncology

## 2021-07-31 VITALS — BP 133/62 | HR 118 | Temp 98.0°F | Resp 16 | Ht 64.0 in | Wt 183.0 lb

## 2021-07-31 DIAGNOSIS — D500 Iron deficiency anemia secondary to blood loss (chronic): Secondary | ICD-10-CM | POA: Diagnosis present

## 2021-07-31 DIAGNOSIS — N8500 Endometrial hyperplasia, unspecified: Secondary | ICD-10-CM

## 2021-07-31 DIAGNOSIS — R5383 Other fatigue: Secondary | ICD-10-CM

## 2021-07-31 DIAGNOSIS — R000 Tachycardia, unspecified: Secondary | ICD-10-CM

## 2021-07-31 DIAGNOSIS — N920 Excessive and frequent menstruation with regular cycle: Secondary | ICD-10-CM | POA: Insufficient documentation

## 2021-07-31 DIAGNOSIS — Z8249 Family history of ischemic heart disease and other diseases of the circulatory system: Secondary | ICD-10-CM

## 2021-07-31 DIAGNOSIS — Z90710 Acquired absence of both cervix and uterus: Secondary | ICD-10-CM

## 2021-07-31 DIAGNOSIS — Z9079 Acquired absence of other genital organ(s): Secondary | ICD-10-CM

## 2021-07-31 LAB — CBC (CANCER CENTER ONLY)
HCT: 36 % (ref 36.0–46.0)
Hemoglobin: 11.6 g/dL — ABNORMAL LOW (ref 12.0–15.0)
MCH: 26.3 pg (ref 26.0–34.0)
MCHC: 32.2 g/dL (ref 30.0–36.0)
MCV: 81.6 fL (ref 80.0–100.0)
Platelet Count: 276 10*3/uL (ref 150–400)
RBC: 4.41 MIL/uL (ref 3.87–5.11)
RDW: 24.9 % — ABNORMAL HIGH (ref 11.5–15.5)
WBC Count: 4.1 10*3/uL (ref 4.0–10.5)
nRBC: 0 % (ref 0.0–0.2)

## 2021-07-31 NOTE — Telephone Encounter (Signed)
Try to call the patient again after sending her a message about CBC.  I think we need to rule out a pulmonary embolism in the postoperative period given her symptoms as well as strong family history.  I have placed an order for it and it is scheduled for next week.  Phone number that patient advised me to call her on went straight to voicemail again.  Jeral Pinch MD Gynecologic Oncology

## 2021-07-31 NOTE — Patient Instructions (Signed)
It was good to see you today.  I am glad you are feeling better.  All of your incisions are healing very well.  Remember no lifting more than 10 pounds until 6 weeks after surgery and nothing in the vagina for at least 8 weeks.  I will release her blood test to you from today.  Please let me know if you develop any new symptoms before I see you next.

## 2021-07-31 NOTE — Progress Notes (Signed)
Gynecologic Oncology Return Clinic Visit  07/31/2021  Reason for Visit: Follow-up after surgery  Treatment History: 07/09/2021: Total robotic hysterectomy with bilateral salpingectomy, mini lap for specimen removal with contained morcellation, cystoscopy for large multi fibroid uterus.  Interval History: Patient is overall feeling much better than at her visit last week.  Has been able to sleep, and is now not using anything but Tylenol for pain.  Reports regular bowel function.  Continues to have some discomfort when she urinates that is overall improving.  Tolerating p.o. intake without nausea or emesis.  Feels quite fatigued.  Denies any vaginal bleeding or discharge.  Past Medical/Surgical History: Past Medical History:  Diagnosis Date   Anemia 06/30/2021   last iron transfusion   Family history of pulmonary embolism 07/31/2017   Multiple thyroid nodules     Past Surgical History:  Procedure Laterality Date   Cesarian  N/A 2004   IR ANGIOGRAM PELVIS SELECTIVE OR SUPRASELECTIVE  07/08/2021   IR ANGIOGRAM PELVIS SELECTIVE OR SUPRASELECTIVE  07/08/2021   IR ANGIOGRAM SELECTIVE EACH ADDITIONAL VESSEL  07/08/2021   IR ANGIOGRAM SELECTIVE EACH ADDITIONAL VESSEL  07/08/2021   IR EMBO TUMOR ORGAN ISCHEMIA INFARCT INC GUIDE ROADMAPPING  07/08/2021   IR RADIOLOGIST EVAL & MGMT  06/11/2021   IR US GUIDE VASC ACCESS RIGHT  07/08/2021   MYOMECTOMY N/A 1999   ROBOTIC ASSISTED LAPAROSCOPIC HYSTERECTOMY AND SALPINGECTOMY Bilateral 07/09/2021   Procedure: XI ROBOTIC ASSISTED LAPAROSCOPIC HYSTERECTOMY GREATER THAN TWO HUNDRED AND FIFTY GRAMS AND [place] LAPAROTOMY cystoscopy;  Surgeon: Lafonda Mosses, MD;  Location: [facility];  Service: Gynecology;  Laterality: Bilateral;   uterine fibroid emoblization  N/A 2014    Family History  Problem Relation Age of Onset   Pulmonary embolism Mother        following international flight   Breast cancer Mother    Prostate cancer Father     Pulmonary embolism Sister    Pulmonary embolism Sister    Pulmonary embolism Maternal Aunt        Twin of the patient's mother, also occurred following and international flight   Colon cancer Neg Hx    Ovarian cancer Neg Hx    Endometrial cancer Neg Hx    Pancreatic cancer Neg Hx     Social History   Socioeconomic History   Marital status: Divorced    Spouse name: Not on file   Number of children: Not on file   Years of education: Not on file   Highest education level: Not on file  Occupational History   Not on file  Tobacco Use   Smoking status: Never   Smokeless tobacco: Never  Vaping Use   Vaping Use: Never used  Substance and Sexual Activity   Alcohol use: Yes    Alcohol/week: 1.0 standard drink    Types: 1 Glasses of wine per week   Drug use: No   Sexual activity: Not Currently  Other Topics Concern   Not on file  Social History Narrative   Not on file   Social Determinants of Health   Financial Resource Strain: Not on file  Food Insecurity: Not on file  Transportation Needs: Not on file  Physical Activity: Not on file  Stress: Not on file  Social Connections: Not on file    Current Medications:  Current Outpatient Medications:    enoxaparin (LOVENOX) 40 MG/0.4ML injection, Inject 0.4 mLs (40 mg total) into the skin daily for 10 days., Disp: 4 mL, Rfl: 0  ibuprofen (ADVIL) 800 MG tablet, Take 1 tablet (800 mg total) by mouth every 8 (eight) hours as needed for moderate pain. For AFTER surgery only (Patient not taking: Reported on 07/31/2021), Disp: 30 tablet, Rfl: 0   senna-docusate (SENOKOT-S) 8.6-50 MG tablet, Take 2 tablets by mouth at bedtime. For AFTER surgery, do not take if having diarrhea (Patient not taking: Reported on 07/31/2021), Disp: 30 tablet, Rfl: 0   traMADol (ULTRAM) 50 MG tablet, Take 1 tablet (50 mg total) by mouth every 6 (six) hours as needed for severe pain. For AFTER surgery only, do not take and drive (Patient not taking: Reported on  07/31/2021), Disp: 15 tablet, Rfl: 0  Review of Systems: Denies appetite changes, fevers, chills, fatigue, unexplained weight changes. Denies hearing loss, neck lumps or masses, mouth sores, ringing in ears or voice changes. Denies cough or wheezing.  Denies shortness of breath. Denies chest pain or palpitations. Denies leg swelling. Denies abdominal distention, pain, blood in stools, constipation, diarrhea, nausea, vomiting, or early satiety. Denies pain with intercourse, dysuria, frequency, hematuria or incontinence. Denies hot flashes, pelvic pain, vaginal bleeding or vaginal discharge.   Denies joint pain, back pain or muscle pain/cramps. Denies itching, rash, or wounds. Denies dizziness, headaches, numbness or seizures. Denies swollen lymph nodes or glands, denies easy bruising or bleeding. Denies anxiety, depression, confusion, or decreased concentration.  Physical Exam: BP 133/62 (BP Location: Left Arm, Patient Position: Sitting)    Pulse (!) 118    Temp 98 F (36.7 C) (Tympanic)    Resp 16    Ht 5\' 4"  (1.626 m)    Wt 183 lb (83 kg)    SpO2 100%    BMI 31.41 kg/m  General: Alert, oriented, no acute distress. HEENT: Normocephalic, atraumatic, sclera anicteric. Chest: Clear to auscultation bilaterally.  No wheezes or rhonchi. Cardiovascular: Tachycardic with heart rate in the 100s, regular rhythm, no murmurs. Abdomen: soft, nontender.  Normoactive bowel sounds.  No masses or hepatosplenomegaly appreciated.  Well-healed incisions, remaining Dermabond removed. Extremities: Grossly normal range of motion.  Warm, well perfused.  No edema bilaterally. Skin: No rashes or lesions noted. GU: Normal appearing external genitalia without erythema, excoriation, or lesions.  Speculum exam reveals cuff intact, suture visible, no bleeding or discharge.  Bimanual exam reveals cuff intact, no fluctuance or tenderness with palpation.    Laboratory & Radiologic Studies: Pathology from 12/15: A.  ENDOMETRIUM, BIOPSY:  - Blood and poorly preserved endometrial tissue.   B. UTERUS, CERVIX, BILATERAL TUBES:  - Morcellated specimen weighing 2,017 grams.  - Cervix with ciliated metaplasia.  - Poorly preserved endometrium.  - Myometrium with adenomyosis and intravascular material consistent with  prior embolization procedure.  - Fragments of leiomyomata with calcification and changes consistent  with prior embolization.  - Two fallopian tubes with fimbriated end, benign paratubal cyst, and  focal intravascular material consistent with prior embolization  procedure.  - Negative for malignancy, see comment.  COMMENT:  Poor preservation of the endometrium limits the microscopic evaluation.   Assessment & Plan: Betty Gutierrez is a 51 y.o. woman who is approximately 3 weeks status post robotic hysterectomy and bilateral salpingectomy for gigantic uterine fibroid requiring contained morcellation through mini laparotomy.  Patient is overall doing well and meeting postoperative milestones.  We discussed continued activity restrictions and postoperative expectations.  She is feeling much better than she did a week ago.  I suspect that this was a combination of recovery from surgery and taking medications that she is not used  to.  Given significant improvement in symptoms, my suspicion that this is related to sudden withdrawal of hormones is quite low.  Her ovaries were left intact at the time of surgery, and I think it is very unlikely that blood supply was damaged to both ovaries.  I am somewhat concerned given her tachycardia and what she describes as significant fatigue.  I have discussed getting a repeat CBC with her today.  This was stable, confirming no ongoing blood loss after surgery.  In the setting of her significant family history of pulmonary embolism, her symptoms, and her tachycardia on exam today, I am recommending CT angio to rule out pulmonary embolism.  I will plan to see the  patient back in 4-6 weeks to assure that she continues to recover well postoperatively.  32 minutes of total time was spent for this patient encounter, including preparation, face-to-face counseling with the patient and coordination of care, and documentation of the encounter.  Jeral Pinch, MD  Division of Gynecologic Oncology  Department of Obstetrics and Gynecology  [facility]

## 2021-08-01 ENCOUNTER — Encounter (HOSPITAL_COMMUNITY): Payer: Self-pay | Admitting: *Deleted

## 2021-08-01 ENCOUNTER — Emergency Department (HOSPITAL_COMMUNITY): Payer: 59

## 2021-08-01 ENCOUNTER — Emergency Department (HOSPITAL_COMMUNITY)
Admission: EM | Admit: 2021-08-01 | Discharge: 2021-08-01 | Disposition: A | Discharge status: Home / Self Care | Payer: 59 | Attending: Emergency Medicine | Admitting: Emergency Medicine

## 2021-08-01 DIAGNOSIS — R0602 Shortness of breath: Secondary | ICD-10-CM | POA: Diagnosis not present

## 2021-08-01 DIAGNOSIS — M79604 Pain in right leg: Secondary | ICD-10-CM | POA: Insufficient documentation

## 2021-08-01 DIAGNOSIS — R079 Chest pain, unspecified: Secondary | ICD-10-CM | POA: Diagnosis not present

## 2021-08-01 DIAGNOSIS — Z20822 Contact with and (suspected) exposure to covid-19: Secondary | ICD-10-CM | POA: Diagnosis not present

## 2021-08-01 DIAGNOSIS — R000 Tachycardia, unspecified: Secondary | ICD-10-CM | POA: Insufficient documentation

## 2021-08-01 DIAGNOSIS — M79605 Pain in left leg: Secondary | ICD-10-CM | POA: Insufficient documentation

## 2021-08-01 DIAGNOSIS — R5383 Other fatigue: Secondary | ICD-10-CM | POA: Insufficient documentation

## 2021-08-01 LAB — BASIC METABOLIC PANEL
Anion gap: 9 (ref 5–15)
BUN: 9 mg/dL (ref 6–20)
CO2: 20 mmol/L — ABNORMAL LOW (ref 22–32)
Calcium: 8.7 mg/dL — ABNORMAL LOW (ref 8.9–10.3)
Chloride: 105 mmol/L (ref 98–111)
Creatinine, Ser: 0.72 mg/dL (ref 0.44–1.00)
GFR, Estimated: >60 mL/min (ref >60)
Glucose, Bld: 101 mg/dL — ABNORMAL HIGH (ref 70–99)
Potassium: 3.8 mmol/L (ref 3.5–5.1)
Sodium: 134 mmol/L — ABNORMAL LOW (ref 135–145)

## 2021-08-01 LAB — CBC
HCT: 38.7 % (ref 36.0–46.0)
Hemoglobin: 12.2 g/dL (ref 12.0–15.0)
MCH: 26.1 pg (ref 26.0–34.0)
MCHC: 31.5 g/dL (ref 30.0–36.0)
MCV: 82.9 fL (ref 80.0–100.0)
Platelets: 275 10*3/uL (ref 150–400)
RBC: 4.67 MIL/uL (ref 3.87–5.11)
RDW: 24.9 % — ABNORMAL HIGH (ref 11.5–15.5)
WBC: 4.9 10*3/uL (ref 4.0–10.5)
nRBC: 0 % (ref 0.0–0.2)

## 2021-08-01 LAB — RESP PANEL BY RT-PCR (FLU A&B, COVID) ARPGX2
Influenza A by PCR: NEGATIVE
Influenza B by PCR: NEGATIVE
SARS Coronavirus 2 by RT PCR: NEGATIVE

## 2021-08-01 LAB — TROPONIN I (HIGH SENSITIVITY): Troponin I (High Sensitivity): <2 ng/L (ref <18)

## 2021-08-01 IMAGING — CT CT ANGIO CHEST
2 of 6 series · 18 of 36 positions shown · IV contrast (omnipaque)
Comparison: None available.  No prior radiograph.

CLINICAL DATA: Chest pain or SOB, pleurisy or effusion suspected
PCP requesting CT scan of chest for PE due to hx of blood clots in
family

EXAM:
CT ANGIOGRAPHY CHEST WITH CONTRAST
TECHNIQUE: Multidetector CT imaging of the chest was performed using the
standard protocol during bolus administration of intravenous
contrast. Multiplanar CT image reconstructions and MIPs were
obtained to evaluate the vascular anatomy.
CONTRAST:  80mL OMNIPAQUE IOHEXOL 350 MG/ML SOLN

[Series 5: thins · axial · 0.62mm/px · z∈[-502,-256]mm · 17 of 279 slices shown]
[im 16/279  lung]
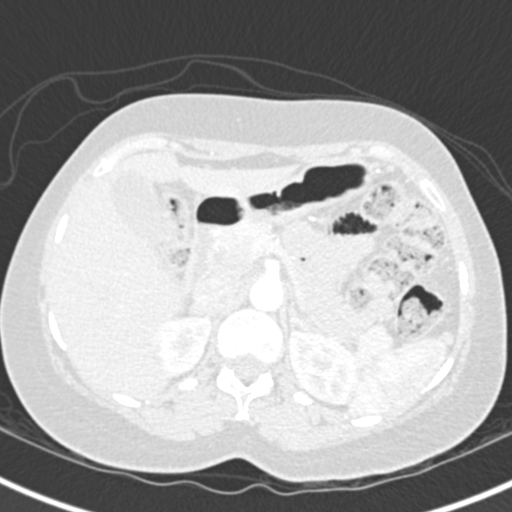
[im 31/279  mediastinal]
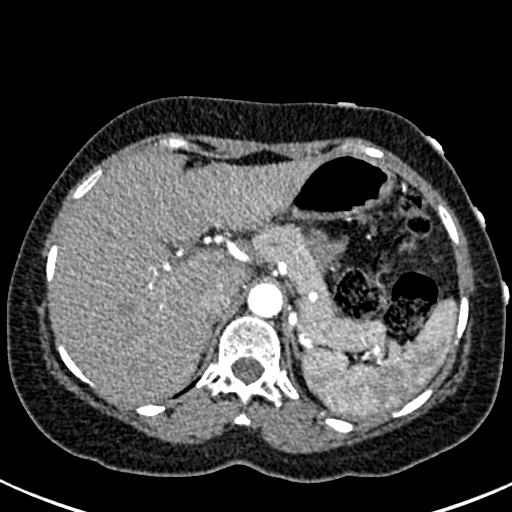
[im 47/279  lung]
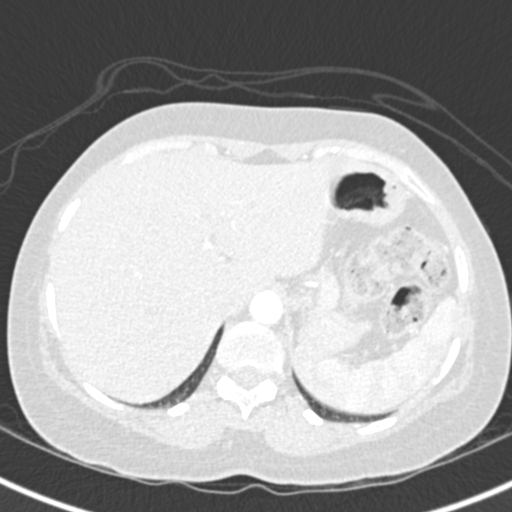
[im 62/279  mediastinal]
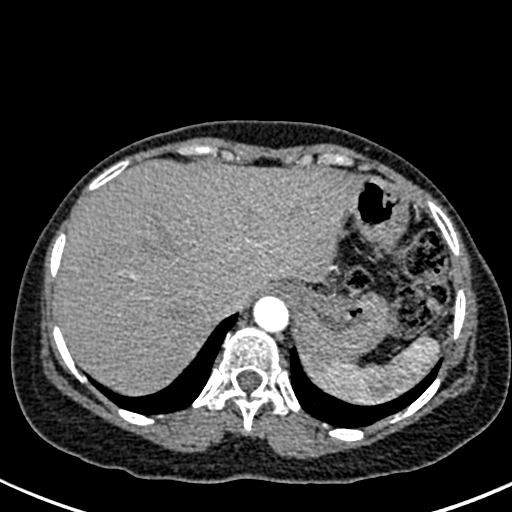
[im 78/279  lung]
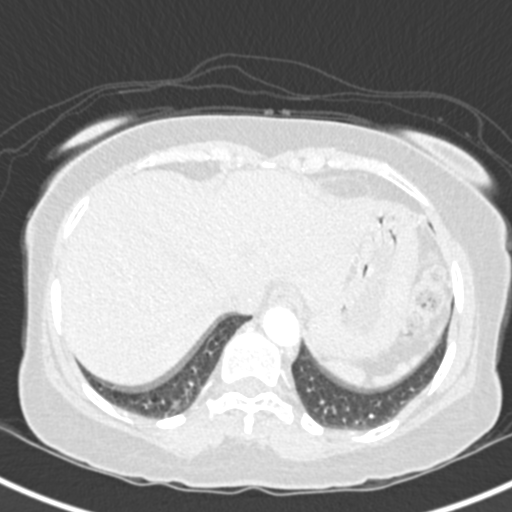
[im 93/279  mediastinal]
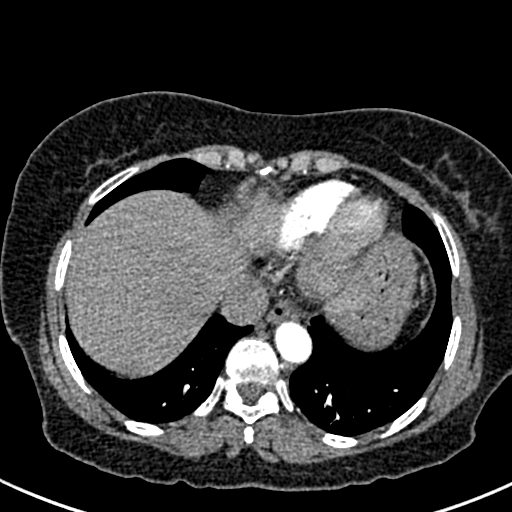
[im 109/279  lung]
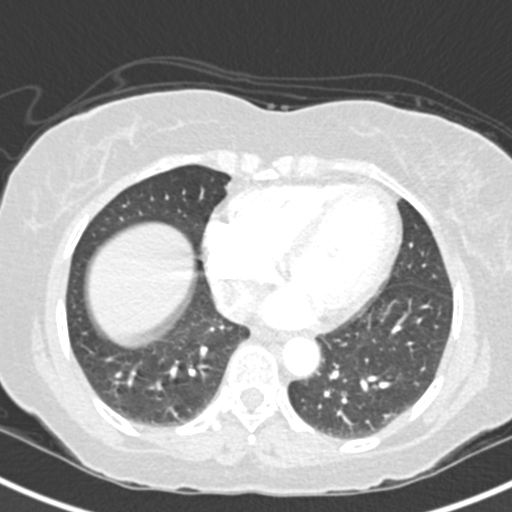
[im 124/279  mediastinal]
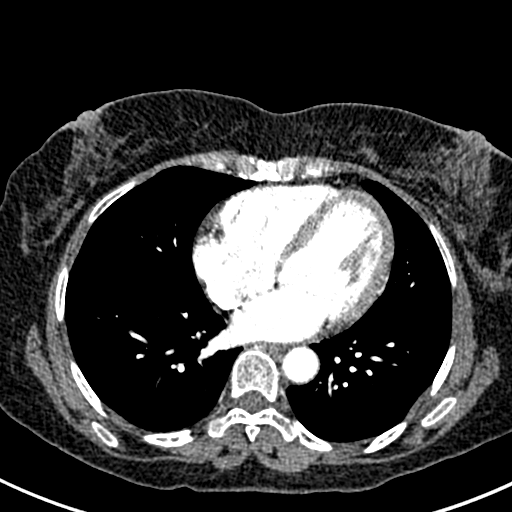
[im 140/279  lung]
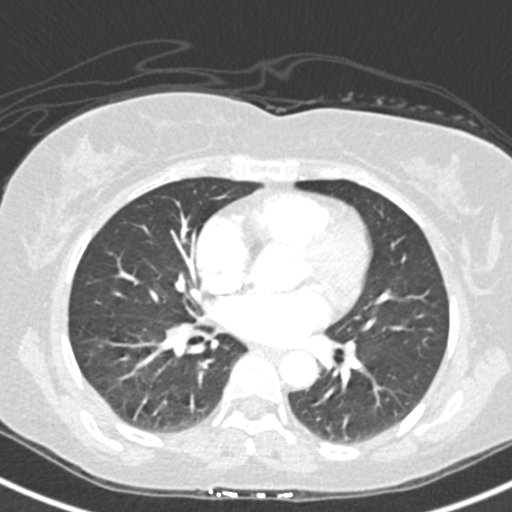
[im 155/279  mediastinal]
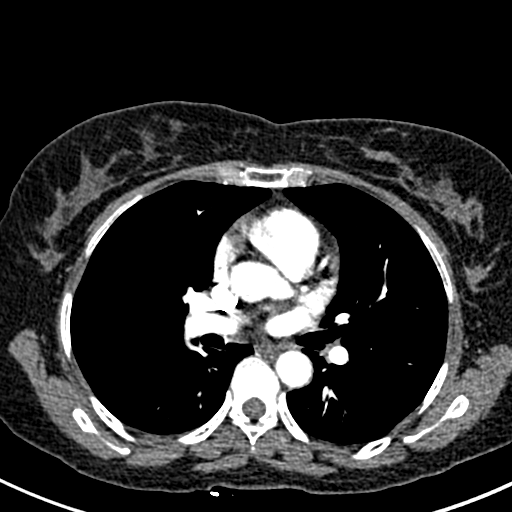
[im 170/279  lung]
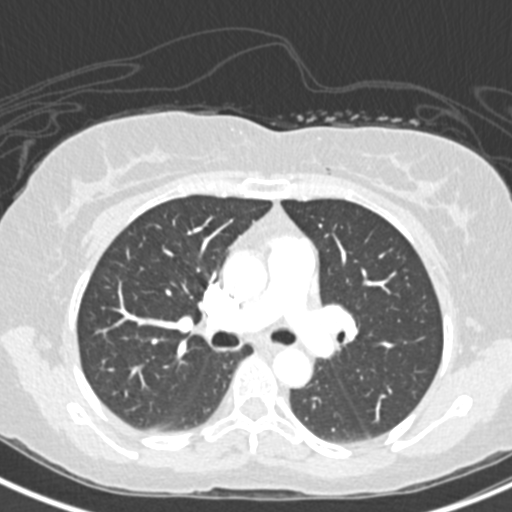
[im 186/279  mediastinal]
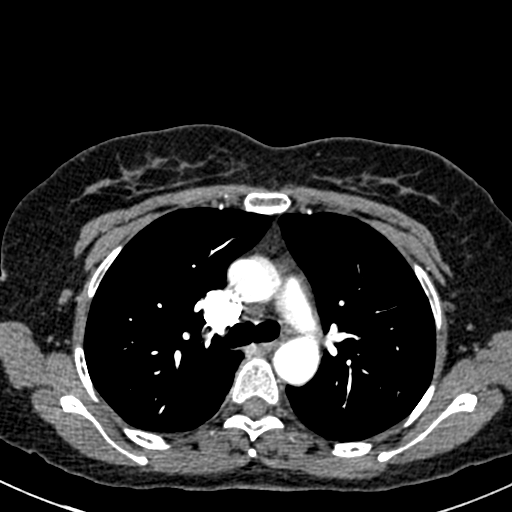
[im 201/279  lung]
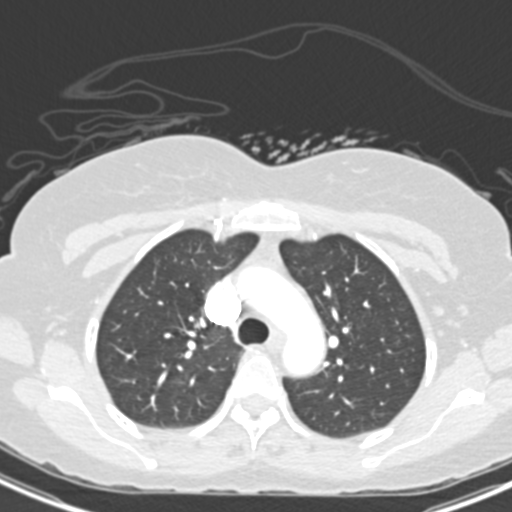
[im 217/279  mediastinal]
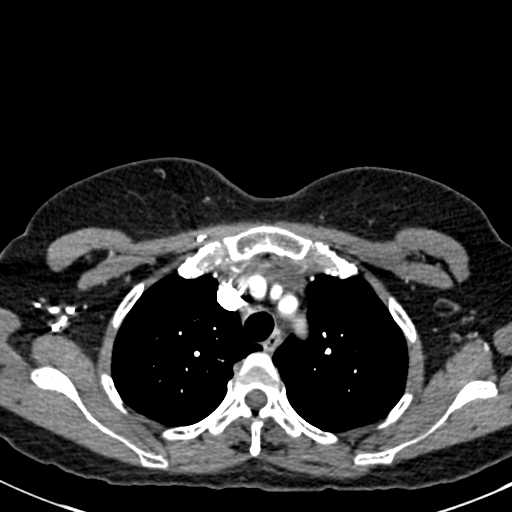
[im 232/279  lung]
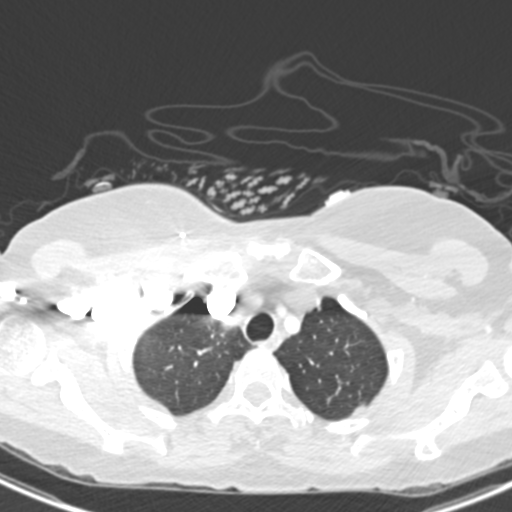
[im 248/279  mediastinal]
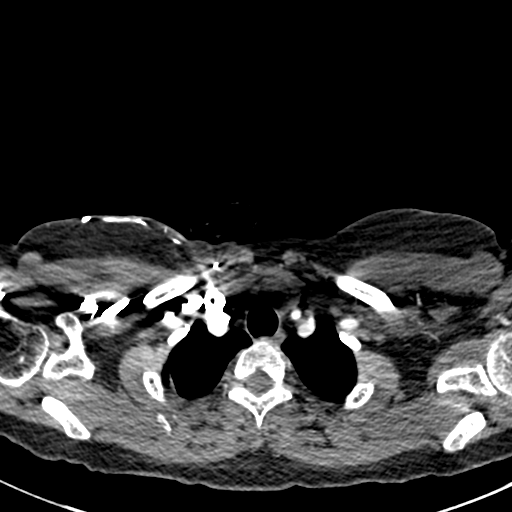
[im 263/279  lung]
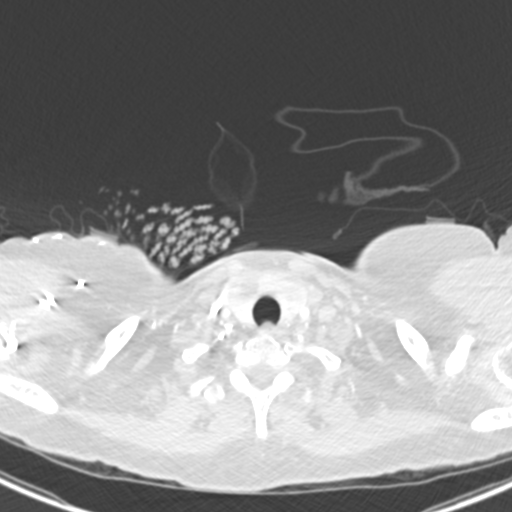

[Series 7: coronal mpr · coronal · 0.65mm/px · 1 of 132 slices shown]
[im 66/132  mediastinal]
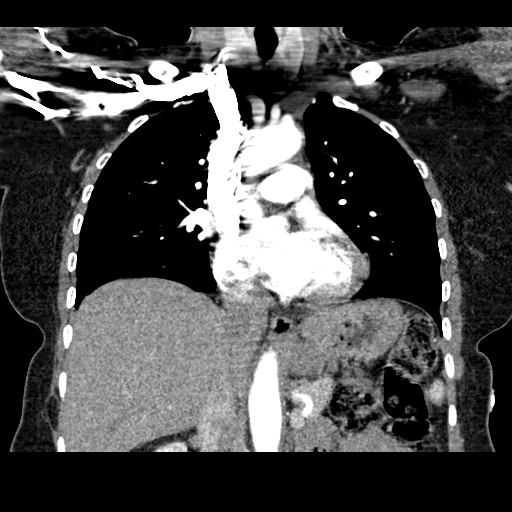

[18 of 36 positions shown; findings below may reference images not displayed]

FINDINGS: Cardiovascular: There are no filling defects within the pulmonary
arteries to suggest pulmonary embolus. Thoracic aorta is normal in
caliber. No dissection or acute findings. The heart is normal in
size. Small amount of fluid in the superior pericardial recess
without significant pericardial effusion.

Mediastinum/Nodes: No enlarged mediastinal or hilar lymph nodes.
Decompressed esophagus. Multiple bilateral hypodense thyroid
nodules, largest on the left measures 2.3 cm. This has been
evaluated on previous imaging. (ref: [HOSPITAL]. [DATE]): 143-50).Thyroid ultrasound [DATE]

Lungs/Pleura: Clear lungs. No focal airspace disease. No
consolidation. No pleural fluid. No pulmonary edema. No pulmonary
mass or nodule. Trachea and central bronchi are patent.

Upper Abdomen: No acute findings.

Musculoskeletal: There are no acute or suspicious osseous
abnormalities. Small amount of air in the left anterior chest wall
soft tissues is likely related to IV catheter placement.

Review of the MIP images confirms the above findings.
IMPRESSION: 1. No pulmonary embolus or acute intrathoracic abnormality.
2. Multiple bilateral hypodense thyroid nodules, largest on the left
measures 2.3 cm. This has been evaluated on previous imaging.
Thyroid ultrasound [DATE].

## 2021-08-01 MED ORDER — IOHEXOL 350 MG/ML SOLN
80.0000 mL | Freq: Once | INTRAVENOUS | Status: AC | PRN
  Administered 2021-08-01: 80 mL via INTRAVENOUS

## 2021-08-01 NOTE — ED Triage Notes (Addendum)
PCP sent pt in due to elevated heart rate and feeling fatigued. Pt has CT ordered for beginning of week, was told she can come to the ED and get a CT. Pts VS stable in triage

## 2021-08-01 NOTE — ED Provider Notes (Signed)
[facility] DEPT Provider Note   CSN: [id number] Arrival date & time: 08/01/21  1124     History  Chief Complaint  Patient presents with   Elevated Heart Rate   Fatigue    Betty Gutierrez is a 51 y.o. female.  HPI Patient presents for fatigue and tachycardia.  She recently underwent hysterectomy and bilateral salpingectomy.  She was seen by her gynecology office yesterday.  There was concern of her tachycardia and fatigue.  She does reportedly have a family history of PE.  She was instructed to come to the ED for CTA evaluation.  Following her surgical procedures, which took place on 12/14 and 12/15, patient was undergoing Lovenox injections for 10 days.  She is not currently on any blood thinning medications.  She has not noticed any lower extremity swelling.  She has had intermittent areas of pain in both of her legs.  She will have intermittent sharp twinges of pain in her chest.  She has had exertional shortness of breath.  Currently, she is taking a 6-week time off work due to the surgery.  She denies any recent fevers or chills.    Home Medications Prior to Admission medications   Medication Sig Start Date End Date Taking? Authorizing Provider  enoxaparin (LOVENOX) 40 MG/0.4ML injection Inject 0.4 mLs (40 mg total) into the skin daily for 10 days. 07/07/21 07/17/21  Lincoln Brigham, PA-C  ibuprofen (ADVIL) 800 MG tablet Take 1 tablet (800 mg total) by mouth every 8 (eight) hours as needed for moderate pain. For AFTER surgery only Patient not taking: Reported on 07/31/2021 07/07/21   Joylene John D, NP  senna-docusate (SENOKOT-S) 8.6-50 MG tablet Take 2 tablets by mouth at bedtime. For AFTER surgery, do not take if having diarrhea Patient not taking: Reported on 07/31/2021 07/07/21   Joylene John D, NP  traMADol (ULTRAM) 50 MG tablet Take 1 tablet (50 mg total) by mouth every 6 (six) hours as needed for severe pain. For AFTER surgery only, do not take  and drive Patient not taking: Reported on 07/31/2021 07/07/21   Joylene John D, NP      Allergies    Hydromorphone and Tramadol    Review of Systems   Review of Systems  Constitutional:  Positive for fatigue. Negative for activity change, appetite change, chills, diaphoresis and fever.  HENT:  Negative for congestion, ear pain and sore throat.   Eyes:  Negative for pain and visual disturbance.  Respiratory:  Positive for shortness of breath (Exertional). Negative for cough and chest tightness.   Cardiovascular:  Positive for chest pain (Intermittent, migrating). Negative for leg swelling.  Gastrointestinal:  Negative for abdominal pain, diarrhea, nausea and vomiting.  Genitourinary:  Negative for dysuria, flank pain, hematuria and pelvic pain.  Musculoskeletal:  Positive for myalgias (Intermittent). Negative for arthralgias, back pain and neck pain.  Skin:  Negative for color change and rash.  Neurological:  Negative for dizziness, seizures, syncope, weakness, light-headedness, numbness and headaches.  Hematological:  Does not bruise/bleed easily.  Psychiatric/Behavioral:  Negative for confusion and decreased concentration.   All other systems reviewed and are negative.  Physical Exam Updated Vital Signs BP (!) 136/92    Pulse 88    Temp 98.2 F (36.8 C) (Oral)    Resp 18    Ht 5\' 4"  (1.626 m)    Wt 83 kg    LMP 06/08/2021    SpO2 98%    BMI 31.41 kg/m  Physical Exam  Vitals and nursing note reviewed.  Constitutional:      General: She is not in acute distress.    Appearance: Normal appearance. She is well-developed and normal weight. She is not ill-appearing, toxic-appearing or diaphoretic.  HENT:     Head: Normocephalic and atraumatic.     Right Ear: External ear normal.     Left Ear: External ear normal.     Nose: Nose normal.     Mouth/Throat:     Mouth: Mucous membranes are moist.     Pharynx: Oropharynx is clear.  Eyes:     General: No scleral icterus.    Extraocular  Movements: Extraocular movements intact.     Conjunctiva/sclera: Conjunctivae normal.  Cardiovascular:     Rate and Rhythm: Normal rate and regular rhythm.     Heart sounds: No murmur heard. Pulmonary:     Effort: Pulmonary effort is normal. No respiratory distress.     Breath sounds: Normal breath sounds. No wheezing, rhonchi or rales.  Abdominal:     Palpations: Abdomen is soft.     Tenderness: There is no abdominal tenderness.  Musculoskeletal:        General: No swelling. Normal range of motion.     Cervical back: Normal range of motion and neck supple. No tenderness.     Right lower leg: No edema.     Left lower leg: No edema.  Skin:    General: Skin is warm and dry.     Capillary Refill: Capillary refill takes less than 2 seconds.     Coloration: Skin is not jaundiced or pale.  Neurological:     General: No focal deficit present.     Mental Status: She is alert and oriented to person, place, and time.     Cranial Nerves: No cranial nerve deficit.     Sensory: No sensory deficit.     Motor: No weakness.     Coordination: Coordination normal.  Psychiatric:        Mood and Affect: Mood normal.        Behavior: Behavior normal.        Thought Content: Thought content normal.        Judgment: Judgment normal.    ED Results / Procedures / Treatments   Labs (all labs ordered are listed, but only abnormal results are displayed) Labs Reviewed  BASIC METABOLIC PANEL - Abnormal; Notable for the following components:      Result Value   Sodium 134 (*)    CO2 20 (*)    Glucose, Bld 101 (*)    Calcium 8.7 (*)    All other components within normal limits  CBC - Abnormal; Notable for the following components:   RDW 24.9 (*)    All other components within normal limits  RESP PANEL BY RT-PCR (FLU A&B, COVID) ARPGX2  TROPONIN I (HIGH SENSITIVITY)    EKG EKG Interpretation  Date/Time:  Saturday August 01 2021 11:34:00 EST Ventricular Rate:  110 PR Interval:  128 QRS  Duration: 66 QT Interval:  313 QTC Calculation: 424 R Axis:   78 Text Interpretation: Sinus tachycardia Probable left atrial enlargement Abnormal T, consider ischemia, anterior leads Confirmed by Godfrey Pick ([id number]) on 08/01/2021 7:12:19 PM  Radiology CT Angio Chest PE W and/or Wo Contrast  Result Date: 08/01/2021 CLINICAL DATA:  Chest pain or SOB, pleurisy or effusion suspected PCP requesting CT scan of chest for PE due to hx of blood clots in family EXAM: CT ANGIOGRAPHY CHEST  WITH CONTRAST TECHNIQUE: Multidetector CT imaging of the chest was performed using the standard protocol during bolus administration of intravenous contrast. Multiplanar CT image reconstructions and MIPs were obtained to evaluate the vascular anatomy. CONTRAST:  66mL OMNIPAQUE IOHEXOL 350 MG/ML SOLN COMPARISON:  None available.  No prior radiograph. FINDINGS: Cardiovascular: There are no filling defects within the pulmonary arteries to suggest pulmonary embolus. Thoracic aorta is normal in caliber. No dissection or acute findings. The heart is normal in size. Small amount of fluid in the superior pericardial recess without significant pericardial effusion. Mediastinum/Nodes: No enlarged mediastinal or hilar lymph nodes. Decompressed esophagus. Multiple bilateral hypodense thyroid nodules, largest on the left measures 2.3 cm. This has been evaluated on previous imaging. (ref: J Am Coll Radiol. 2015 Feb;12(2): 143-50).Thyroid ultrasound 03/07/2013 Lungs/Pleura: Clear lungs. No focal airspace disease. No consolidation. No pleural fluid. No pulmonary edema. No pulmonary mass or nodule. Trachea and central bronchi are patent. Upper Abdomen: No acute findings. Musculoskeletal: There are no acute or suspicious osseous abnormalities. Small amount of air in the left anterior chest wall soft tissues is likely related to IV catheter placement. Review of the MIP images confirms the above findings. IMPRESSION: 1. No pulmonary embolus or acute  intrathoracic abnormality. 2. Multiple bilateral hypodense thyroid nodules, largest on the left measures 2.3 cm. This has been evaluated on previous imaging. Thyroid ultrasound 03/07/2013. Electronically Signed   By: Keith Rake M.D.   On: 08/01/2021 18:32    Procedures Procedures    Medications Ordered in ED Medications  iohexol (OMNIPAQUE) 350 MG/ML injection 80 mL (80 mLs Intravenous Contrast Given 08/01/21 1751)    ED Course/ Medical Decision Making/ A&P                           Medical Decision Making  This patient presents to the ED for concern of fatigue, this involves an extensive number of treatment options, and is a complaint that carries with it a high risk of complications and morbidity.  The differential diagnosis includes PE, acute on chronic anemia, infection, electrolyte disturbances, heart failure, ACS, depression.   Co morbidities that complicate the patient evaluation  Patient recently underwent surgery.  She has history of chronic anemia.    Additional history obtained:  External records from outside source obtained and reviewed including EMR: Patient underwent surgery on 12/15.  Surgery was a robotic assisted laparoscopic total hysterectomy with bilateral salpingectomy.  Reason for surgery was uterine fibroids.  Prior to her surgery, she was undergoing iron supplementation to improve her chronic anemia.  Prior to surgery, patient's hemoglobin was 12 g/dL.  Following surgery, hemoglobin was 9.7.  Patient was seen in follow-up 2 days ago.  At that time, there was concern of fatigue and tachycardia, given her family history of VTE.  She was advised to come to the ED for evaluation of possible PE.  Following her surgery, patient was on Lovenox for 2 weeks.  She has discontinued this medication.   Lab Tests:  I Ordered, and personally interpreted labs.  The pertinent results include: Patient's hemoglobin is greater than baseline.  She has no leukocytosis.  COVID and  flu testing was negative.  Electrolytes are normal.  Patient's bicarb is slightly low at 20.  This is a nonspecific finding.  Troponin was normal.   Imaging Studies ordered:  I ordered imaging studies including CTA of chest I independently visualized and interpreted imaging which showed absence of any evidence of PE.  No other findings of acute cardiopulmonary abnormalities to explain the patient's recent fatigue and tachycardia. I agree with the radiologist interpretation   Cardiac Monitoring:  The patient was maintained on a cardiac monitor.  I personally viewed and interpreted the cardiac monitored which showed an underlying rhythm of: Sinus rhythm.  Tachycardia improved while in the ED.   Medicines ordered and prescription drug management:  Patient arrived asymptomatic at rest and remained asymptomatic during her time of observation in the emergency department. I have reviewed the patients home medicines and have made adjustments as needed  Problem List / ED Course:  Patient with fatigue, tachycardia, and exertional shortness of breath following recent hysterectomy surgery.  Per chart review, patient did undergo blood loss during the surgery.  Today, her hemoglobin is back to presurgery levels.  At rest, patient was asymptomatic and well-appearing.  Lungs were clear to auscultation.  Given her concern for PE, CTA of chest was ordered.  Results did not show any evidence of PE.  There were no other findings to suggest cause of her recent symptoms.  Patient was given reassurance.  She was advised to continue to follow-up with her outpatient providers as scheduled.  She was discharged in good condition.   Reevaluation:  Patient remained asymptomatic while in the ED.    Social Determinants of Health:  Patient seems to have good social support and good outpatient follow-up.   Dispostion:  After consideration of the diagnostic results and the patients response to treatment, I feel  that the patent would benefit from discharge from the ED with outpatient follow-up.          Final Clinical Impression(s) / ED Diagnoses Final diagnoses:  Other fatigue    Rx / DC Orders ED Discharge Orders     None         Godfrey Pick, MD 08/03/21 0107

## 2021-08-01 NOTE — ED Provider Triage Note (Signed)
Emergency Medicine Provider Triage Evaluation Note  Betty Gutierrez , a 51 y.o. female  was evaluated in triage.  Pt complains of unsure. Patient supposedly here for CT scan of chest per PCP orders. Patient had this study scheduled for outpatient on Tuesday. Patient presented here for unknown reasons, she will not answer my questions. When I go into the room to try and interview patient she becomes irritated when I inform her that we will CT scan her chest, but she will have to wait like everyone else to see a provider. She told me I was a "stupid white man" who "needs to go back to school to become more educated and not an Environmental consultant". The patient will not provide any more context to her situation besides informing me that her doctor "has ordered a CT scan because I have a history of blood clots and I need to make sure I do not have a blood clot in my lungs". Patient will not answer questions.   Review of Systems  Positive: Unsure  Negative: Unsure  Physical Exam  BP 120/89    Pulse 97    Temp 98.5 F (36.9 C) (Oral)    Resp 15    Ht 5\' 4"  (1.626 m)    Wt 83 kg    LMP 06/08/2021    SpO2 96%    BMI 31.41 kg/m  Gen:   Awake, no distress   Resp:  Normal effort. MSK:   Moves extremities without difficulty. Other:  Patient is completely alert and oriented. Patient pulse rate is 97 and her oxygen saturation is 96%. Patient not complaining of SOB or CP.   Medical Decision Making  Medically screening exam initiated at 12:23 PM.  Appropriate orders placed.  Betty Gutierrez was informed that the remainder of the evaluation will be completed by another provider, this initial triage assessment does not replace that evaluation, and the importance of remaining in the ED until their evaluation is complete.     Azucena Cecil, PA-C 08/01/21 1229

## 2021-08-03 ENCOUNTER — Telehealth: Payer: Self-pay | Admitting: *Deleted

## 2021-08-03 NOTE — Telephone Encounter (Signed)
Per Dr Berline Lopes cancel the patient's CT angio for 1/10, done on 1/7. Patient called and notified. Patient given the next follow up appt

## 2021-08-04 ENCOUNTER — Ambulatory Visit (HOSPITAL_COMMUNITY): Payer: 59

## 2021-08-05 ENCOUNTER — Telehealth: Payer: Self-pay

## 2021-08-05 NOTE — Telephone Encounter (Signed)
Following up with Betty Gutierrez regarding her FMLA paperwork. Paperwork has been completed but patient needs to complete a section and sign the paperwork. Patient would prefer to stop by the cancer to complete paperwork and have our office submit the paperwork.  Advised patient that paperwork will be left at the cancer center front desk for her to complete. When complete she will return paperwork to cancer center front desk and our office will send the documents.  Patient verbalized understanding and states she will come tomorrow to complete this.

## 2021-09-11 ENCOUNTER — Other Ambulatory Visit: Payer: Self-pay

## 2021-09-11 ENCOUNTER — Inpatient Hospital Stay: Payer: 59 | Attending: Gynecologic Oncology | Admitting: Gynecologic Oncology

## 2021-09-11 ENCOUNTER — Encounter: Payer: Self-pay | Admitting: Gynecologic Oncology

## 2021-09-11 VITALS — BP 108/70 | HR 70 | Temp 98.5°F | Resp 18 | Ht 65.35 in | Wt 192.3 lb

## 2021-09-11 DIAGNOSIS — N8500 Endometrial hyperplasia, unspecified: Secondary | ICD-10-CM

## 2021-09-11 DIAGNOSIS — Z9079 Acquired absence of other genital organ(s): Secondary | ICD-10-CM

## 2021-09-11 DIAGNOSIS — Z90710 Acquired absence of both cervix and uterus: Secondary | ICD-10-CM

## 2021-09-11 NOTE — Progress Notes (Signed)
Gynecologic Oncology Return Clinic Visit  09/11/2021  Reason for Visit: Follow-up  Treatment History: 07/09/2021: Total robotic hysterectomy with bilateral salpingectomy, mini lap for specimen removal with contained morcellation, cystoscopy for large multi fibroid uterus. Given symptoms as well as tachycardia at her visit on 1/6, patient referred to the emergency department for evaluation and to rule out pulmonary embolism.  CT imaging was negative for a PE.  Interval History: Patient reports doing very well.  Is feeling back to normal.  Denies any vaginal bleeding or discharge.  Reports normal bowel and bladder function.  Has occasional twinges in her pelvis and almost menstrual like cramping.  Has started going to the gym.  Past Medical/Surgical History: Past Medical History:  Diagnosis Date   Anemia 06/30/2021   last iron transfusion   Family history of pulmonary embolism 07/31/2017   Multiple thyroid nodules     Past Surgical History:  Procedure Laterality Date   ABDOMINAL HYSTERECTOMY     Cesarian  N/A 2004   IR ANGIOGRAM PELVIS SELECTIVE OR SUPRASELECTIVE  07/08/2021   IR ANGIOGRAM PELVIS SELECTIVE OR SUPRASELECTIVE  07/08/2021   IR ANGIOGRAM SELECTIVE EACH ADDITIONAL VESSEL  07/08/2021   IR ANGIOGRAM SELECTIVE EACH ADDITIONAL VESSEL  07/08/2021   IR EMBO TUMOR ORGAN ISCHEMIA INFARCT INC GUIDE ROADMAPPING  07/08/2021   IR RADIOLOGIST EVAL & MGMT  06/11/2021   IR US GUIDE VASC ACCESS RIGHT  07/08/2021   MYOMECTOMY N/A 1999   ROBOTIC ASSISTED LAPAROSCOPIC HYSTERECTOMY AND SALPINGECTOMY Bilateral 07/09/2021   Procedure: XI ROBOTIC ASSISTED LAPAROSCOPIC HYSTERECTOMY GREATER THAN TWO HUNDRED AND FIFTY GRAMS AND [place] LAPAROTOMY cystoscopy;  Surgeon: Lafonda Mosses, MD;  Location: [facility];  Service: Gynecology;  Laterality: Bilateral;   uterine fibroid emoblization  N/A 2014    Family History  Problem Relation Age of Onset   Pulmonary embolism Mother         following international flight   Breast cancer Mother    Prostate cancer Father    Pulmonary embolism Sister    Pulmonary embolism Sister    Pulmonary embolism Maternal Aunt        Twin of the patient's mother, also occurred following and international flight   Colon cancer Neg Hx    Ovarian cancer Neg Hx    Endometrial cancer Neg Hx    Pancreatic cancer Neg Hx     Social History   Socioeconomic History   Marital status: Divorced    Spouse name: Not on file   Number of children: Not on file   Years of education: Not on file   Highest education level: Not on file  Occupational History   Not on file  Tobacco Use   Smoking status: Never   Smokeless tobacco: Never  Vaping Use   Vaping Use: Never used  Substance and Sexual Activity   Alcohol use: Not Currently    Alcohol/week: 1.0 standard drink    Types: 1 Glasses of wine per week   Drug use: No   Sexual activity: Not Currently  Other Topics Concern   Not on file  Social History Narrative   Not on file   Social Determinants of Health   Financial Resource Strain: Not on file  Food Insecurity: Not on file  Transportation Needs: Not on file  Physical Activity: Not on file  Stress: Not on file  Social Connections: Not on file    Current Medications:  Current Outpatient Medications:    enoxaparin (LOVENOX) 40 MG/0.4ML injection, Inject 0.4 mLs (  40 mg total) into the skin daily for 10 days., Disp: 4 mL, Rfl: 0   ibuprofen (ADVIL) 800 MG tablet, Take 1 tablet (800 mg total) by mouth every 8 (eight) hours as needed for moderate pain. For AFTER surgery only (Patient not taking: Reported on 07/31/2021), Disp: 30 tablet, Rfl: 0   senna-docusate (SENOKOT-S) 8.6-50 MG tablet, Take 2 tablets by mouth at bedtime. For AFTER surgery, do not take if having diarrhea (Patient not taking: Reported on 07/31/2021), Disp: 30 tablet, Rfl: 0   traMADol (ULTRAM) 50 MG tablet, Take 1 tablet (50 mg total) by mouth every 6 (six) hours as needed for  severe pain. For AFTER surgery only, do not take and drive (Patient not taking: Reported on 07/31/2021), Disp: 15 tablet, Rfl: 0  Review of Systems: Denies appetite changes, fevers, chills, fatigue, unexplained weight changes. Denies hearing loss, neck lumps or masses, mouth sores, ringing in ears or voice changes. Denies cough or wheezing.  Denies shortness of breath. Denies chest pain or palpitations. Denies leg swelling. Denies abdominal distention, pain, blood in stools, constipation, diarrhea, nausea, vomiting, or early satiety. Denies pain with intercourse, dysuria, frequency, hematuria or incontinence. Denies hot flashes, pelvic pain, vaginal bleeding or vaginal discharge.   Denies joint pain, back pain or muscle pain/cramps. Denies itching, rash, or wounds. Denies dizziness, headaches, numbness or seizures. Denies swollen lymph nodes or glands, denies easy bruising or bleeding. Denies anxiety, depression, confusion, or decreased concentration.  Physical Exam: BP 108/70 (BP Location: Right Arm, Patient Position: Sitting)    Pulse 70    Temp 98.5 F (36.9 C) (Oral)    Resp 18    Ht 5' 5.35" (1.66 m)    Wt 192 lb 4.8 oz (87.2 kg)    LMP 06/08/2021    SpO2 99%    BMI 31.65 kg/m  General: Alert, oriented, no acute distress. HEENT: Normocephalic, atraumatic, sclera anicteric. Chest: Unlabored breathing on room air. Extremities: Grossly normal range of motion.  Warm, well perfused.  No edema bilaterally. Skin: No rashes or lesions noted.  Laboratory & Radiologic Studies: None new  Assessment & Plan: Betty Gutierrez is a 51 y.o. woman who is approximately 2 months status post robotic hysterectomy and bilateral salpingectomy for gigantic uterine fibroid requiring contained morcellation through mini laparotomy.   Patient is doing very well and has completely recovered from surgery.  Discussed that since she still has ovaries, she may have ovulatory pain and will still cycle given hormone  changes.  Discussed that restrictions are all lifted at this point.  My nurse navigator had to help to get her scheduled to establish a PCP.  The patient is interested in seeing an endocrinologist.  I have recommended that she establish with a PCP to help get this referral.  Patient will be released back to care with her OB/GYN.  18 minutes of total time was spent for this patient encounter, including preparation, face-to-face counseling with the patient and coordination of care, and documentation of the encounter.  Jeral Pinch, MD  Division of Gynecologic Oncology  Department of Obstetrics and Gynecology  [facility]

## 2021-09-11 NOTE — Patient Instructions (Signed)
It was good to see you today.  I am glad you are feeling better.  Please do not hesitate to contact me if you need anything moving forward.

## 2024-05-15 ENCOUNTER — Other Ambulatory Visit: Payer: Self-pay | Admitting: Obstetrics and Gynecology

## 2024-05-15 DIAGNOSIS — E041 Nontoxic single thyroid nodule: Secondary | ICD-10-CM

## 2024-05-16 ENCOUNTER — Ambulatory Visit
Admission: RE | Admit: 2024-05-16 | Discharge: 2024-05-16 | Disposition: A | Discharge status: Home / Self Care | Source: Ambulatory Visit | Attending: Obstetrics and Gynecology | Admitting: Obstetrics and Gynecology

## 2024-05-16 DIAGNOSIS — E041 Nontoxic single thyroid nodule: Secondary | ICD-10-CM

## 2024-05-25 ENCOUNTER — Encounter: Payer: Self-pay | Admitting: Obstetrics and Gynecology

## 2024-05-25 ENCOUNTER — Other Ambulatory Visit: Payer: Self-pay | Admitting: Obstetrics and Gynecology

## 2024-05-25 DIAGNOSIS — E041 Nontoxic single thyroid nodule: Secondary | ICD-10-CM
